# Patient Record
Sex: Female | Born: 1955
Health system: Southern US, Community
[De-identification: ages and names within clinical notes are randomized; demographics above are authoritative.]

## PROBLEM LIST (undated history)

## (undated) DIAGNOSIS — R896 Abnormal cytological findings in specimens from other organs, systems and tissues: Secondary | ICD-10-CM

## (undated) DIAGNOSIS — E785 Hyperlipidemia, unspecified: Secondary | ICD-10-CM

## (undated) DIAGNOSIS — R011 Cardiac murmur, unspecified: Secondary | ICD-10-CM

## (undated) DIAGNOSIS — N920 Excessive and frequent menstruation with regular cycle: Secondary | ICD-10-CM

## (undated) DIAGNOSIS — M8580 Other specified disorders of bone density and structure, unspecified site: Secondary | ICD-10-CM

## (undated) DIAGNOSIS — E039 Hypothyroidism, unspecified: Secondary | ICD-10-CM

## (undated) DIAGNOSIS — R1900 Intra-abdominal and pelvic swelling, mass and lump, unspecified site: Secondary | ICD-10-CM

## (undated) DIAGNOSIS — I10 Essential (primary) hypertension: Secondary | ICD-10-CM

## (undated) DIAGNOSIS — M810 Age-related osteoporosis without current pathological fracture: Secondary | ICD-10-CM

## (undated) DIAGNOSIS — R000 Tachycardia, unspecified: Secondary | ICD-10-CM

## (undated) DIAGNOSIS — R0683 Snoring: Secondary | ICD-10-CM

## (undated) DIAGNOSIS — D219 Benign neoplasm of connective and other soft tissue, unspecified: Secondary | ICD-10-CM

## (undated) DIAGNOSIS — R400 Somnolence: Secondary | ICD-10-CM

## (undated) DIAGNOSIS — N7011 Chronic salpingitis: Secondary | ICD-10-CM

## (undated) DIAGNOSIS — R002 Palpitations: Secondary | ICD-10-CM

## (undated) DIAGNOSIS — G43909 Migraine, unspecified, not intractable, without status migrainosus: Secondary | ICD-10-CM

## (undated) HISTORY — DX: Somnolence: R40.0

## (undated) HISTORY — DX: Intra-abdominal and pelvic swelling, mass and lump, unspecified site: R19.00

## (undated) HISTORY — DX: Tachycardia, unspecified: R00.0

## (undated) HISTORY — DX: Chronic salpingitis: N70.11

## (undated) HISTORY — DX: Palpitations: R00.2

## (undated) HISTORY — DX: Essential (primary) hypertension: I10

## (undated) HISTORY — DX: Snoring: R06.83

## (undated) HISTORY — DX: Excessive and frequent menstruation with regular cycle: N92.0

## (undated) HISTORY — DX: Migraine, unspecified, not intractable, without status migrainosus: G43.909

## (undated) HISTORY — DX: Hyperlipidemia, unspecified: E78.5

## (undated) HISTORY — DX: Cardiac murmur, unspecified: R01.1

## (undated) HISTORY — DX: Other specified disorders of bone density and structure, unspecified site: M85.80

## (undated) HISTORY — DX: Benign neoplasm of connective and other soft tissue, unspecified: D21.9

## (undated) HISTORY — DX: Hypothyroidism, unspecified: E03.9

## (undated) HISTORY — DX: Abnormal cytological findings in specimens from other organs, systems and tissues: R89.6

## (undated) HISTORY — PX: OTHER SURGICAL HISTORY: SHX169

## (undated) HISTORY — DX: Age-related osteoporosis without current pathological fracture: M81.0

---

## 1998-08-17 ENCOUNTER — Other Ambulatory Visit: Admission: RE | Admit: 1998-08-17 | Discharge: 1998-08-17 | Payer: Self-pay | Admitting: Obstetrics and Gynecology

## 1999-11-16 ENCOUNTER — Other Ambulatory Visit: Admission: RE | Admit: 1999-11-16 | Discharge: 1999-11-16 | Payer: Self-pay | Admitting: Obstetrics and Gynecology

## 2000-02-14 ENCOUNTER — Encounter: Payer: Self-pay | Admitting: Internal Medicine

## 2000-02-14 ENCOUNTER — Encounter: Admission: RE | Admit: 2000-02-14 | Discharge: 2000-02-14 | Payer: Self-pay | Admitting: Internal Medicine

## 2001-01-15 ENCOUNTER — Other Ambulatory Visit: Admission: RE | Admit: 2001-01-15 | Discharge: 2001-01-15 | Payer: Self-pay | Admitting: Obstetrics and Gynecology

## 2002-10-22 ENCOUNTER — Other Ambulatory Visit: Admission: RE | Admit: 2002-10-22 | Discharge: 2002-10-22 | Payer: Self-pay | Admitting: Obstetrics and Gynecology

## 2004-05-09 DIAGNOSIS — M810 Age-related osteoporosis without current pathological fracture: Secondary | ICD-10-CM

## 2004-05-09 DIAGNOSIS — M8580 Other specified disorders of bone density and structure, unspecified site: Secondary | ICD-10-CM

## 2004-05-09 HISTORY — DX: Other specified disorders of bone density and structure, unspecified site: M85.80

## 2004-05-09 HISTORY — DX: Age-related osteoporosis without current pathological fracture: M81.0

## 2004-10-13 ENCOUNTER — Other Ambulatory Visit: Admission: RE | Admit: 2004-10-13 | Discharge: 2004-10-13 | Payer: Self-pay | Admitting: Obstetrics and Gynecology

## 2006-03-16 ENCOUNTER — Other Ambulatory Visit: Admission: RE | Admit: 2006-03-16 | Discharge: 2006-03-16 | Payer: Self-pay | Admitting: Obstetrics and Gynecology

## 2006-05-12 ENCOUNTER — Encounter: Admission: RE | Admit: 2006-05-12 | Discharge: 2006-05-12 | Payer: Self-pay | Admitting: Radiology

## 2007-05-10 DIAGNOSIS — IMO0001 Reserved for inherently not codable concepts without codable children: Secondary | ICD-10-CM

## 2007-05-10 DIAGNOSIS — N7011 Chronic salpingitis: Secondary | ICD-10-CM

## 2007-05-10 DIAGNOSIS — D219 Benign neoplasm of connective and other soft tissue, unspecified: Secondary | ICD-10-CM

## 2007-05-10 HISTORY — DX: Chronic salpingitis: N70.11

## 2007-05-10 HISTORY — DX: Benign neoplasm of connective and other soft tissue, unspecified: D21.9

## 2007-05-10 HISTORY — DX: Reserved for inherently not codable concepts without codable children: IMO0001

## 2008-05-09 DIAGNOSIS — N920 Excessive and frequent menstruation with regular cycle: Secondary | ICD-10-CM

## 2008-05-09 HISTORY — DX: Excessive and frequent menstruation with regular cycle: N92.0

## 2008-07-14 ENCOUNTER — Ambulatory Visit: Payer: Self-pay | Admitting: Genetic Counselor

## 2008-09-11 ENCOUNTER — Ambulatory Visit: Payer: Self-pay | Admitting: Surgery

## 2008-10-17 ENCOUNTER — Encounter: Admission: RE | Admit: 2008-10-17 | Discharge: 2008-10-17 | Payer: Self-pay | Admitting: Otolaryngology

## 2009-05-28 ENCOUNTER — Ambulatory Visit (HOSPITAL_COMMUNITY): Admission: RE | Admit: 2009-05-28 | Discharge: 2009-05-28 | Payer: Self-pay | Admitting: Obstetrics and Gynecology

## 2009-10-23 ENCOUNTER — Encounter: Admission: RE | Admit: 2009-10-23 | Discharge: 2009-10-23 | Payer: Self-pay | Admitting: Orthopedic Surgery

## 2010-06-15 ENCOUNTER — Other Ambulatory Visit: Payer: Self-pay | Admitting: Obstetrics and Gynecology

## 2010-06-15 DIAGNOSIS — Z803 Family history of malignant neoplasm of breast: Secondary | ICD-10-CM

## 2010-06-15 DIAGNOSIS — R922 Inconclusive mammogram: Secondary | ICD-10-CM

## 2010-06-28 ENCOUNTER — Ambulatory Visit
Admission: RE | Admit: 2010-06-28 | Discharge: 2010-06-28 | Disposition: A | Discharge status: Home / Self Care | Payer: PRIVATE HEALTH INSURANCE | Source: Ambulatory Visit | Attending: Obstetrics and Gynecology | Admitting: Obstetrics and Gynecology

## 2010-06-28 DIAGNOSIS — Z803 Family history of malignant neoplasm of breast: Secondary | ICD-10-CM

## 2010-06-28 DIAGNOSIS — R922 Inconclusive mammogram: Secondary | ICD-10-CM

## 2010-06-28 MED ORDER — GADOBENATE DIMEGLUMINE 529 MG/ML IV SOLN
15.0000 mL | Freq: Once | INTRAVENOUS | Status: AC | PRN
  Administered 2010-06-28: 15 mL via INTRAVENOUS

## 2010-06-30 ENCOUNTER — Other Ambulatory Visit: Payer: Self-pay | Admitting: Obstetrics and Gynecology

## 2010-06-30 DIAGNOSIS — R928 Other abnormal and inconclusive findings on diagnostic imaging of breast: Secondary | ICD-10-CM

## 2010-07-05 ENCOUNTER — Other Ambulatory Visit: Payer: Self-pay

## 2010-07-05 ENCOUNTER — Other Ambulatory Visit: Payer: Self-pay | Admitting: Obstetrics and Gynecology

## 2010-07-05 ENCOUNTER — Ambulatory Visit
Admission: RE | Admit: 2010-07-05 | Discharge: 2010-07-05 | Disposition: A | Discharge status: Home / Self Care | Payer: PRIVATE HEALTH INSURANCE | Source: Ambulatory Visit | Attending: Obstetrics and Gynecology | Admitting: Obstetrics and Gynecology

## 2010-07-05 DIAGNOSIS — R928 Other abnormal and inconclusive findings on diagnostic imaging of breast: Secondary | ICD-10-CM

## 2010-07-06 ENCOUNTER — Other Ambulatory Visit: Payer: Self-pay | Admitting: Obstetrics and Gynecology

## 2010-07-06 DIAGNOSIS — R928 Other abnormal and inconclusive findings on diagnostic imaging of breast: Secondary | ICD-10-CM

## 2010-07-07 ENCOUNTER — Other Ambulatory Visit: Payer: Self-pay | Admitting: Diagnostic Radiology

## 2010-07-07 ENCOUNTER — Ambulatory Visit
Admission: RE | Admit: 2010-07-07 | Discharge: 2010-07-07 | Disposition: A | Discharge status: Home / Self Care | Payer: PRIVATE HEALTH INSURANCE | Source: Ambulatory Visit | Attending: Obstetrics and Gynecology | Admitting: Obstetrics and Gynecology

## 2010-07-07 ENCOUNTER — Other Ambulatory Visit: Payer: PRIVATE HEALTH INSURANCE

## 2010-07-07 DIAGNOSIS — R928 Other abnormal and inconclusive findings on diagnostic imaging of breast: Secondary | ICD-10-CM

## 2010-07-07 MED ORDER — GADOBENATE DIMEGLUMINE 529 MG/ML IV SOLN
10.0000 mL | Freq: Once | INTRAVENOUS | Status: AC | PRN
  Administered 2010-07-07: 10 mL via INTRAVENOUS

## 2010-07-26 LAB — CBC
HCT: 42.2 % (ref 36.0–46.0)
MCHC: 33.9 g/dL (ref 30.0–36.0)
Platelets: 233 10*3/uL (ref 150–400)
RDW: 12.4 % (ref 11.5–15.5)

## 2010-09-21 NOTE — Procedures (Signed)
CAROTID DUPLEX EXAM   INDICATION:  Right ear tinnitus and bruit.   HISTORY:  Diabetes:  No.  Cardiac:  No.  Hypertension:  No.  Smoking:  No.  Previous Surgery:  No.  CV History:  Patient complains of the sound of her heartbeat in her  right ear for 2 months.  Amaurosis Fugax No, Paresthesias No, Hemiparesis No.                                       RIGHT             LEFT  Brachial systolic pressure:         154               148  Brachial Doppler waveforms:         Triphasic         Triphasic  Vertebral direction of flow:        Antegrade         Antegrade  DUPLEX VELOCITIES (cm/sec)  CCA peak systolic                   113               79  ECA peak systolic                   99                66  ICA peak systolic                   133               104  ICA end diastolic                   62                46  PLAQUE MORPHOLOGY:                  Soft              Soft  PLAQUE AMOUNT:                      Mild to moderate  Mild to moderate  PLAQUE LOCATION:                    Mid ICA           Mid ICA   IMPRESSION:  1. A 40-59% right ICA stenosis.  2. A 20-39% left ICA stenosis.        ___________________________________________  V. Charlena Cross, MD   MC/MEDQ  D:  09/11/2008  T:  09/11/2008  Job:  [id number]

## 2011-05-10 DIAGNOSIS — R000 Tachycardia, unspecified: Secondary | ICD-10-CM

## 2011-05-10 DIAGNOSIS — R011 Cardiac murmur, unspecified: Secondary | ICD-10-CM

## 2011-05-10 HISTORY — DX: Cardiac murmur, unspecified: R01.1

## 2011-05-10 HISTORY — DX: Tachycardia, unspecified: R00.0

## 2011-06-24 ENCOUNTER — Ambulatory Visit
Admission: RE | Admit: 2011-06-24 | Discharge: 2011-06-24 | Disposition: A | Discharge status: Home / Self Care | Payer: PRIVATE HEALTH INSURANCE | Source: Ambulatory Visit | Attending: Obstetrics and Gynecology | Admitting: Obstetrics and Gynecology

## 2011-06-24 ENCOUNTER — Other Ambulatory Visit: Payer: Self-pay | Admitting: Obstetrics and Gynecology

## 2011-07-22 HISTORY — PX: TRANSTHORACIC ECHOCARDIOGRAM: SHX275

## 2011-12-07 ENCOUNTER — Telehealth: Payer: Self-pay | Admitting: Obstetrics and Gynecology

## 2011-12-07 NOTE — Telephone Encounter (Signed)
Chandra/call back

## 2011-12-08 NOTE — Telephone Encounter (Signed)
Tc to pt per telephone call. Pt needs an appt to discuss tx for osteoporosis. Appt sched 12/20/11@11 :15 with vph. Pt agrees.

## 2011-12-14 DIAGNOSIS — G43909 Migraine, unspecified, not intractable, without status migrainosus: Secondary | ICD-10-CM

## 2011-12-14 DIAGNOSIS — R000 Tachycardia, unspecified: Secondary | ICD-10-CM | POA: Insufficient documentation

## 2011-12-14 DIAGNOSIS — M810 Age-related osteoporosis without current pathological fracture: Secondary | ICD-10-CM

## 2011-12-14 DIAGNOSIS — R1900 Intra-abdominal and pelvic swelling, mass and lump, unspecified site: Secondary | ICD-10-CM | POA: Insufficient documentation

## 2011-12-14 DIAGNOSIS — N7011 Chronic salpingitis: Secondary | ICD-10-CM | POA: Insufficient documentation

## 2011-12-14 DIAGNOSIS — IMO0001 Reserved for inherently not codable concepts without codable children: Secondary | ICD-10-CM

## 2011-12-14 DIAGNOSIS — N920 Excessive and frequent menstruation with regular cycle: Secondary | ICD-10-CM

## 2011-12-14 DIAGNOSIS — D219 Benign neoplasm of connective and other soft tissue, unspecified: Secondary | ICD-10-CM | POA: Insufficient documentation

## 2011-12-14 DIAGNOSIS — R011 Cardiac murmur, unspecified: Secondary | ICD-10-CM | POA: Insufficient documentation

## 2011-12-14 DIAGNOSIS — M8580 Other specified disorders of bone density and structure, unspecified site: Secondary | ICD-10-CM | POA: Insufficient documentation

## 2011-12-20 ENCOUNTER — Ambulatory Visit (INDEPENDENT_AMBULATORY_CARE_PROVIDER_SITE_OTHER): Payer: PRIVATE HEALTH INSURANCE | Admitting: Obstetrics and Gynecology

## 2011-12-20 ENCOUNTER — Encounter: Payer: Self-pay | Admitting: Obstetrics and Gynecology

## 2011-12-20 VITALS — BP 120/64 | Ht 63.75 in | Wt 168.0 lb

## 2011-12-20 DIAGNOSIS — N951 Menopausal and female climacteric states: Secondary | ICD-10-CM

## 2011-12-20 DIAGNOSIS — M810 Age-related osteoporosis without current pathological fracture: Secondary | ICD-10-CM

## 2011-12-20 MED ORDER — ALENDRONATE SODIUM 70 MG PO TABS: 70.0000 mg | ORAL_TABLET | ORAL | Status: AC

## 2011-12-20 NOTE — Progress Notes (Signed)
GYN PROBLEM VISIT  [name] is a 56 y.o. year old female,G2P2, who presents for a problem visit.   Subjective:  Pt here to discuss medication for dx of osteoporosis back in 06/2011.  She has since that time been evaluated by Dr. Rennis Golden at [facility] vascular who has started propranolol with improvement in her migraine headaches and in her palpitations. Improvement in palpitations have allowed her to sleep better. She continues with some hot flashes.  Objective:  BP 120/64  Ht 5' 3.75" (1.619 m)  Wt 168 lb (76.204 kg)  BMI 29.06 kg/m2     Assessment: Osteoporosis with normal FRAX Family history of osteoporosis Strong family history of breast cancer Improved migraines and palpitations Continued problems with insomnia and hot flashes   Plan: Options for management of the patient's symptoms are discussed. Evista would address the family history of breast cancer and the osteoporosis but would leave her with increased hot flashes. She does opts for Fosamax 70 mg p.o. Weekly.  She denies any history of reflux disease She will followup in 6 weeks. She is counseled on increasing her exercise to 6 days per week and maintaining her calcium and vitamin D   Dierdre Forth, md  12/20/2011 11:41 AM

## 2011-12-20 NOTE — Patient Instructions (Addendum)
Menopause Menopause is the normal time of life when menstrual periods stop completely. Menopause is complete when you have missed 12 consecutive menstrual periods. It usually occurs between the ages of 48 to 55, with an average age of 51. Very rarely does a woman develop menopause before 58 years old. At menopause, your ovaries stop producing the female hormones, estrogen and progesterone. This can cause undesirable symptoms and also affect your health. Sometimes the symptoms may occur 4 to 5 years before the menopause begins. There is no relationship between menopause and:  Oral contraceptives.   Number of children you had.   Race.   The age your menstrual periods started (menarche).  Heavy smokers and very thin women may develop menopause earlier in life. CAUSES  The ovaries stop producing the female hormones estrogen and progesterone.   Other causes include:   Surgery to remove both ovaries.   The ovaries stop functioning for no known reason.   Tumors of the pituitary gland in the brain.   Medical disease that affects the ovaries and hormone production.   Radiation treatment to the abdomen or pelvis.   Chemotherapy that affects the ovaries.  SYMPTOMS   Hot flashes.   Night sweats.   Decrease in sex drive.   Vaginal dryness and thinning of the vagina causing painful intercourse.   Dryness of the skin and developing wrinkles.   Headaches.   Tiredness.   Irritability.   Memory problems.   Weight gain.   Bladder infections.   Hair growth of the face and chest.   Infertility.  More serious symptoms include:  Loss of bone (osteoporosis) causing breaks (fractures).   Depression.   Hardening and narrowing of the arteries (atherosclerosis) causing heart attacks and strokes.  DIAGNOSIS   When the menstrual periods have stopped for 12 straight months.   Physical exam.   Hormone studies of the blood.  TREATMENT  There are many treatment choices and nearly  as many questions about them. The decisions to treat or not to treat menopausal changes is an individual choice made with your caregiver. Your caregiver can discuss the treatments with you. Together, you can decide which treatment will work best for you. Your treatment choices may include:   Hormone therapy (estorgen and progesterone).   Non-hormonal medications.   Treating the individual symptoms with medication (for example antidepressants for depression).   Herbal medications that may help specific symptoms.   Counseling by a psychiatrist or psychologist.   Group therapy.   Lifestyle changes including:   Eating healthy.   Regular exercise.   Limiting caffeine and alcohol.   Stress management and meditation.   No treatment.  HOME CARE INSTRUCTIONS   Take the medication your caregiver gives you as directed.   Get plenty of sleep and rest.   Exercise regularly.   Eat a diet that contains calcium (good for the bones) and soy products (acts like estrogen hormone).   Avoid alcoholic beverages.   Do not smoke.   If you have hot flashes, dress in layers.   Take supplements, calcium and vitamin D to strengthen bones.   You can use over-the-counter lubricants or moisturizers for vaginal dryness.   Group therapy is sometimes very helpful.   Acupuncture may be helpful in some cases.  SEEK MEDICAL CARE IF:   You are not sure you are in menopause.   You are having menopausal symptoms and need advice and treatment.   You are still having menstrual periods after age 55.     You have pain with intercourse.   Menopause is complete (no menstrual period for 12 months) and you develop vaginal bleeding.   You need a referral to a specialist (gynecologist, psychiatrist or psychologist) for treatment.  SEEK IMMEDIATE MEDICAL CARE IF:   You have severe depression.   You have excessive vaginal bleeding.   You fell and think you have a broken bone.   You have pain when you  urinate.   You develop leg or chest pain.   You have a fast pounding heart beat (palpitations).   You have severe headaches.   You develop vision problems.   You feel a lump in your breast.   You have abdominal pain or severe indigestion.  Document Released: 07/16/2003 Document Revised: 04/14/2011 Document Reviewed: 02/21/2008 [employer] Patient Information 2012 [employer], [place] Osteoporosis Osteoporosis is a disease of the bones that makes them weaker and prone to break (fracture). By their mid-30s, most people begin to gradually lose bone strength. If this is severe enough, osteoporosis may occur. Osteopenia is a less severe weakness of the bones, which places you at risk for osteoporosis. It is important to identify if you have osteoporosis or osteopenia. Bone fractures from osteoporosis (especially hip and spine fractures) are a major cause of hospitalization, loss of independence, and can lead to life-threatening complications. CAUSES  There are a number of causes and risk factors:  Gender. Women are at a higher risk for osteoporosis than men.   Age. Bone formation slows down with age.   Ethnicity. For unclear reasons, white and Asian women are at higher risk for osteoporosis. Hispanic and African American women are at increased, but lesser, risk.   Family history of osteoporosis can mean that you are at a higher risk for getting it.   History of bone fractures indicates you may be at higher risk of another.   Calcium is very important for bone health and strength. Not enough calcium in your diet increases your risk for osteoporosis. Vitamin D is important for calcium metabolism. You get vitamin D from sunlight, foods, or supplements.   Physical activity. Bones get stronger with weight-bearing exercise and weaker without use.   Smoking is associated with decreased bone strength.   Medicines. Cortisone medicines, too much thyroid medicine, some cancer and seizure medicines, and  others can weaken bones and cause osteoporosis.   Decreased body weight is associated with osteoporosis. The small amount of estrogen-type molecules produced in fat cells seems to protect the bones.   Menopausal decrease in the hormone estrogen can cause osteoporosis.   Low levels of the hormone testosterone can cause osteoporosis.   Some medical conditions can lead to osteoporosis (hyperthyroidism, hyperparathyroidism, B12 deficiency).  SYMPTOMS  Usually, no symptoms are felt as the bones weaken. The first symptoms are generally related to bone fractures. You may have silent, tiny bone fractures, especially in your spine. This can cause height loss and forward bending of the spine (kyphosis). DIAGNOSIS  You or your caregiver may suspect osteoporosis based on height loss and kyphosis. Osteoporosis or osteopenia may be identified on an X-ray done for other reasons. A bone density measurement will likely be taken. Your bones are often measured at your lower spine or your hips. Measurement is done by an X-ray called a DEXA scan, or sometimes by a computerized X-ray scan (CT or CAT scan). Other tests may be done to find the cause of osteoporosis, such as blood tests to measure calcium and vitamin D, or to monitor treatment. TREATMENT  The goal of osteoporosis treatment is to prevent fractures. This is done through medicine and home care treatments. Treatment will slow the weakening of your bones and strengthen them where possible. Measures to decrease the likelihood of falling and fracturing a bone are also important. Medicine  You may need supplements if you are not getting enough calcium, vitamin D, and vitamin B12.   If you are female and menopausal, you should discuss the option of estrogen replacement or estrogen-like medicine with your caregiver.   Medicines can be taken by mouth or injection to help build bone strength. When taken by mouth, there are important directions that you need to  follow.   Calcitonin is a hormone made by the thyroid gland that can help build bone strength and decrease fracture risk in the spine. It can be taken by nasal spray or injection.   Parathyroid hormone can be injected to help build bone strength.   You will need to continue to get enough calcium intake with any of these medicines.  FALL PREVENTION  If you are unsteady on your feet, use a cane, walker, or walk with someone's help.   Remove loose rugs or electrical cords from your home.   Keep your home well lit at night. Use glasses if you need them.   Avoid icy streets and wet or waxed floors.   Hold the railing when using stairs.   Watch out for your pets.   Install grab bars in your bathroom.   Exercise. Physical activity, especially weight-bearing exercise, helps strengthen bones. Strength and balance exercise, such as tai chi, helps prevent falls.   Alcohol and some medicines can make you more likely to fall. Discuss alcohol use with your caregiver. Ask your caregiver if any of your medicines might increase your risk for falling. Ask if safer alternatives are available.  HOME CARE INSTRUCTIONS   Try to prevent and avoid falls.   To pick up objects, bend at the knees. Do not bend with your back.   Do not smoke. If you smoke, ask for help to stop.   Have adequate calcium and vitamin D in your diet. Talk with your caregiver about amounts.   Before exercising, ask your caregiver what exercises will be good for you.   Only take over-the-counter or prescription medicines for pain, discomfort, or fever as directed by your caregiver.  SEEK MEDICAL CARE IF:   You have had a fracture and your pain is not controlled.   You have had a fracture and you are not able to return to activities as expected.   You are reinjured.   You develop side effects from medicines, especially stomach pain or trouble swallowing.   You develop new, unexplained problems.  SEEK IMMEDIATE MEDICAL  CARE IF:   You develop sudden, severe pain in your back.   You develop pain after an injury or fall.  Document Released: 02/02/2005 Document Revised: 04/14/2011 Document Reviewed: 04/09/2011 [facility] Patient Information 2012 [place], [place]..Insomnia Insomnia is frequent trouble falling and/or staying asleep. Insomnia can be a long term problem or a short term problem. Both are common. Insomnia can be a short term problem when the wakefulness is related to a certain stress or worry. Long term insomnia is often related to ongoing stress during waking hours and/or poor sleeping habits. Overtime, sleep deprivation itself can make the problem worse. Every little thing feels more severe because you are overtired and your ability to cope is decreased. CAUSES   Stress, anxiety, and  depression.   Poor sleeping habits.   Distractions such as TV in the bedroom.   Naps close to bedtime.   Engaging in emotionally charged conversations before bed.   Technical reading before sleep.   Alcohol and other sedatives. They may make the problem worse. They can hurt normal sleep patterns and normal dream activity.   Stimulants such as caffeine for several hours prior to bedtime.   Pain syndromes and shortness of breath can cause insomnia.   Exercise late at night.   Changing time zones may cause sleeping problems (jet lag).  It is sometimes helpful to have someone observe your sleeping patterns. They should look for periods of not breathing during the night (sleep apnea). They should also look to see how long those periods last. If you live alone or observers are uncertain, you can also be observed at a sleep clinic where your sleep patterns will be professionally monitored. Sleep apnea requires a checkup and treatment. Give your caregivers your medical history. Give your caregivers observations your family has made about your sleep.  SYMPTOMS   Not feeling rested in the morning.   Anxiety and  restlessness at bedtime.   Difficulty falling and staying asleep.  TREATMENT   Your caregiver may prescribe treatment for an underlying medical disorders. Your caregiver can give advice or help if you are using alcohol or other drugs for self-medication. Treatment of underlying problems will usually eliminate insomnia problems.   Medications can be prescribed for short time use. They are generally not recommended for lengthy use.   Over-the-counter sleep medicines are not recommended for lengthy use. They can be habit forming.   You can promote easier sleeping by making lifestyle changes such as:   Using relaxation techniques that help with breathing and reduce muscle tension.   Exercising earlier in the day.   Changing your diet and the time of your last meal. No night time snacks.   Establish a regular time to go to bed.   Counseling can help with stressful problems and worry.   Soothing music and white noise may be helpful if there are background noises you cannot remove.   Stop tedious detailed work at least one hour before bedtime.  HOME CARE INSTRUCTIONS   Keep a diary. Inform your caregiver about your progress. This includes any medication side effects. See your caregiver regularly. Take note of:   Times when you are asleep.   Times when you are awake during the night.   The quality of your sleep.   How you feel the next day.  This information will help your caregiver care for you.  Get out of bed if you are still awake after 15 minutes. Read or do some quiet activity. Keep the lights down. Wait until you feel sleepy and go back to bed.   Keep regular sleeping and waking hours. Avoid naps.   Exercise regularly.   Avoid distractions at bedtime. Distractions include watching television or engaging in any intense or detailed activity like attempting to balance the household checkbook.   Develop a bedtime ritual. Keep a familiar routine of bathing, brushing your  teeth, climbing into bed at the same time each night, listening to soothing music. Routines increase the success of falling to sleep faster.   Use relaxation techniques. This can be using breathing and muscle tension release routines. It can also include visualizing peaceful scenes. You can also help control troubling or intruding thoughts by keeping your mind occupied with boring or  repetitive thoughts like the old concept of counting sheep. You can make it more creative like imagining planting one beautiful flower after another in your backyard garden.   During your day, work to eliminate stress. When this is not possible use some of the previous suggestions to help reduce the anxiety that accompanies stressful situations.  MAKE SURE YOU:   Understand these instructions.   Will watch your condition.   Will get help right away if you are not doing well or get worse.  Document Released: 04/22/2000 Document Revised: 04/14/2011 Document Reviewed: 05/23/2007 [facility] Patient Information 2012 [place], [place].

## 2012-02-02 ENCOUNTER — Encounter: Payer: Self-pay | Admitting: Obstetrics and Gynecology

## 2012-02-02 ENCOUNTER — Ambulatory Visit (INDEPENDENT_AMBULATORY_CARE_PROVIDER_SITE_OTHER): Payer: PRIVATE HEALTH INSURANCE | Admitting: Obstetrics and Gynecology

## 2012-02-02 VITALS — BP 116/66 | Ht 63.0 in | Wt 169.0 lb

## 2012-02-02 DIAGNOSIS — M949 Disorder of cartilage, unspecified: Secondary | ICD-10-CM

## 2012-02-02 DIAGNOSIS — M899 Disorder of bone, unspecified: Secondary | ICD-10-CM

## 2012-02-02 DIAGNOSIS — M810 Age-related osteoporosis without current pathological fracture: Secondary | ICD-10-CM

## 2012-02-02 DIAGNOSIS — M8580 Other specified disorders of bone density and structure, unspecified site: Secondary | ICD-10-CM

## 2012-02-02 NOTE — Progress Notes (Signed)
GYN PROBLEM VISIT  F/U  [name] is a 56 y.o. year old female,G2P2, who presents for followup of Fosamax started 6 wks ago  Subjective:  Pt is here to f/u on medication Fosamax started at last visit 12/20/2011. She denies any sx of reflux, no nausea or vomiting. Pt states that she is also still having hot flashes although they are not as often as before. She notes that her hot flashes are associated with increased palpitaitons. She notes her migraines have almost disappeared altogether.  Objective:  BP 116/66  Ht 5\' 3"  (1.6 m)  Wt 169 lb (76.658 kg)  BMI 29.94 kg/m2    Assessment: Osteoporosis, tolerating Rx well Palpitations improving but associated with hot flashes  Plan: Continue Fosamax Keep appt with cardiologist F/u aex  Dierdre Forth, MD  02/02/2012 11:07 AM

## 2012-08-20 IMAGING — CR DG LUMBAR SPINE COMPLETE 4+V
5 series · 5 of 5 positions shown · non-contrast
Comparison: None

CLINICAL DATA: Back pain.

LUMBAR SPINE - COMPLETE 4+ VIEW

[view not recorded (1 of 5)]
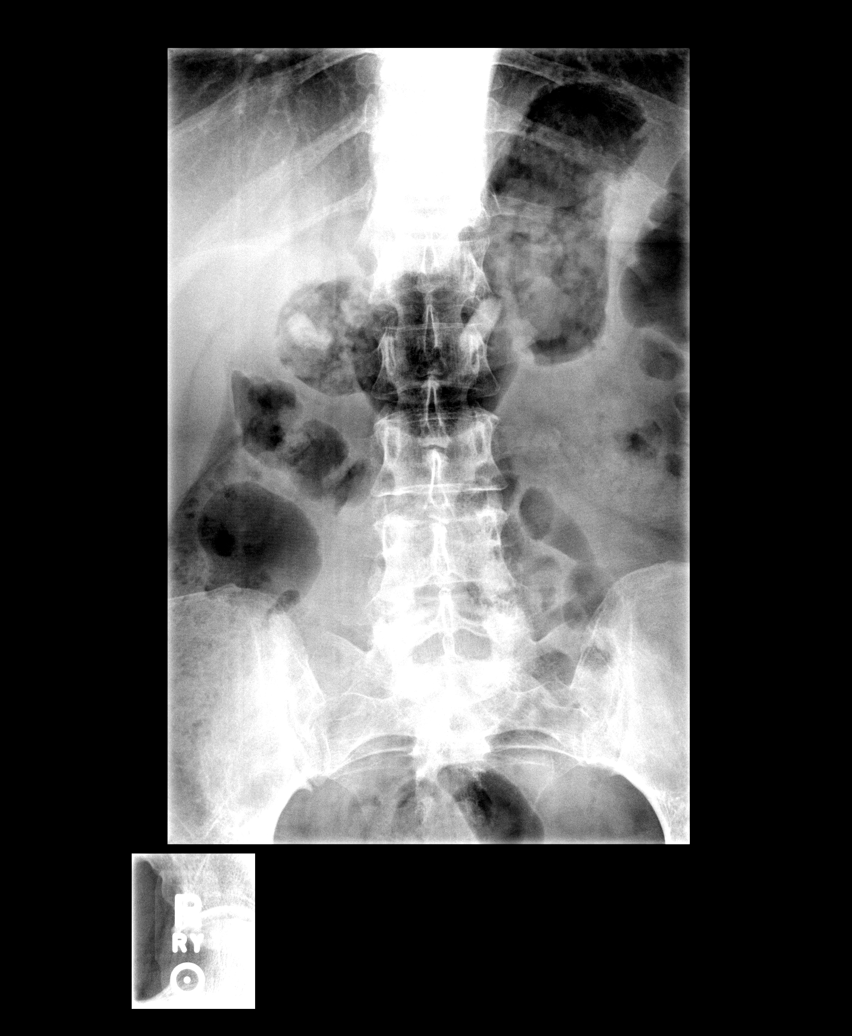

[view not recorded (2 of 5)]
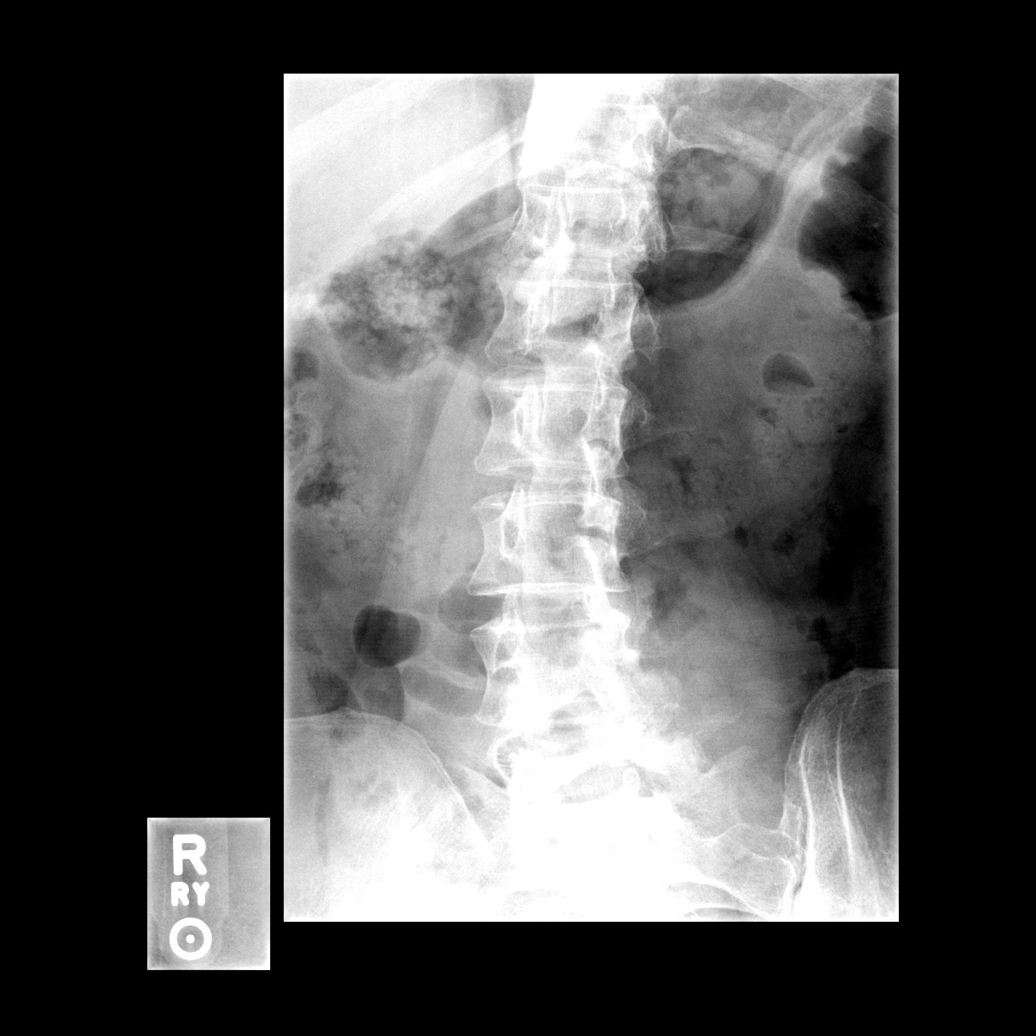

[view not recorded (3 of 5)]
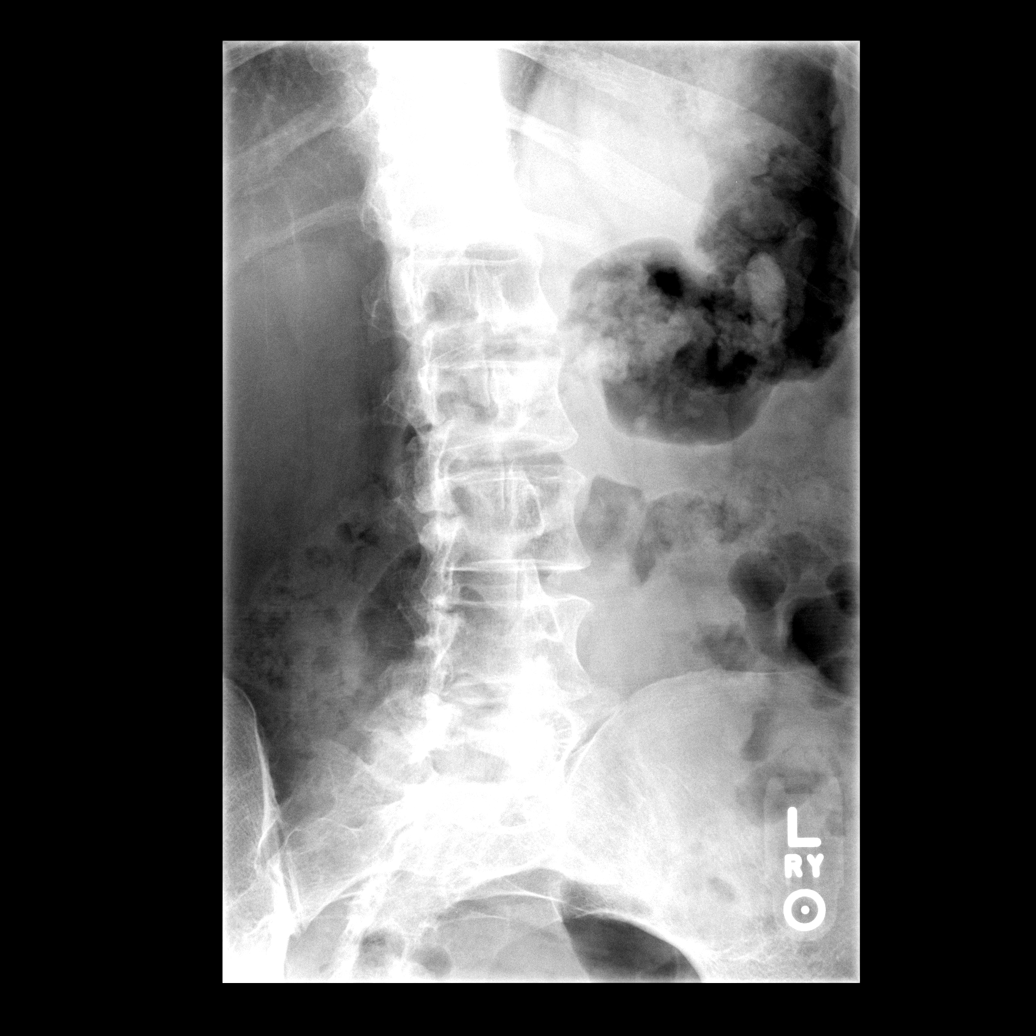

[view not recorded (4 of 5)]
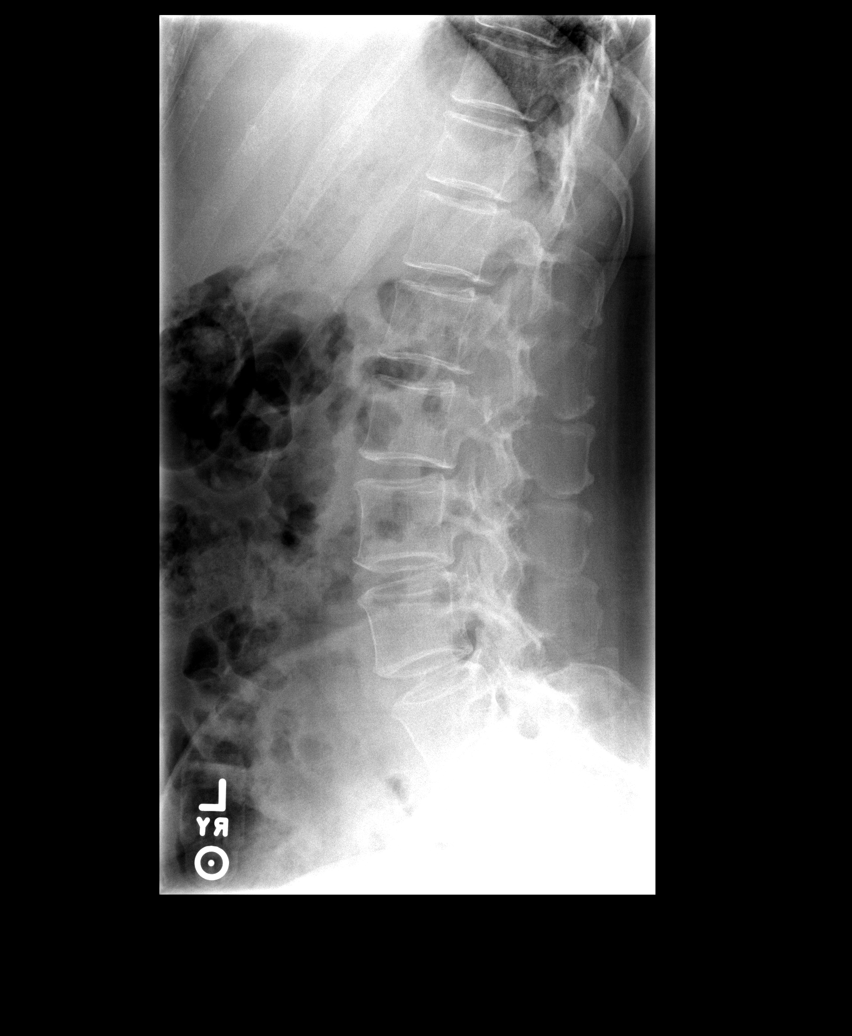

[view not recorded (5 of 5)]
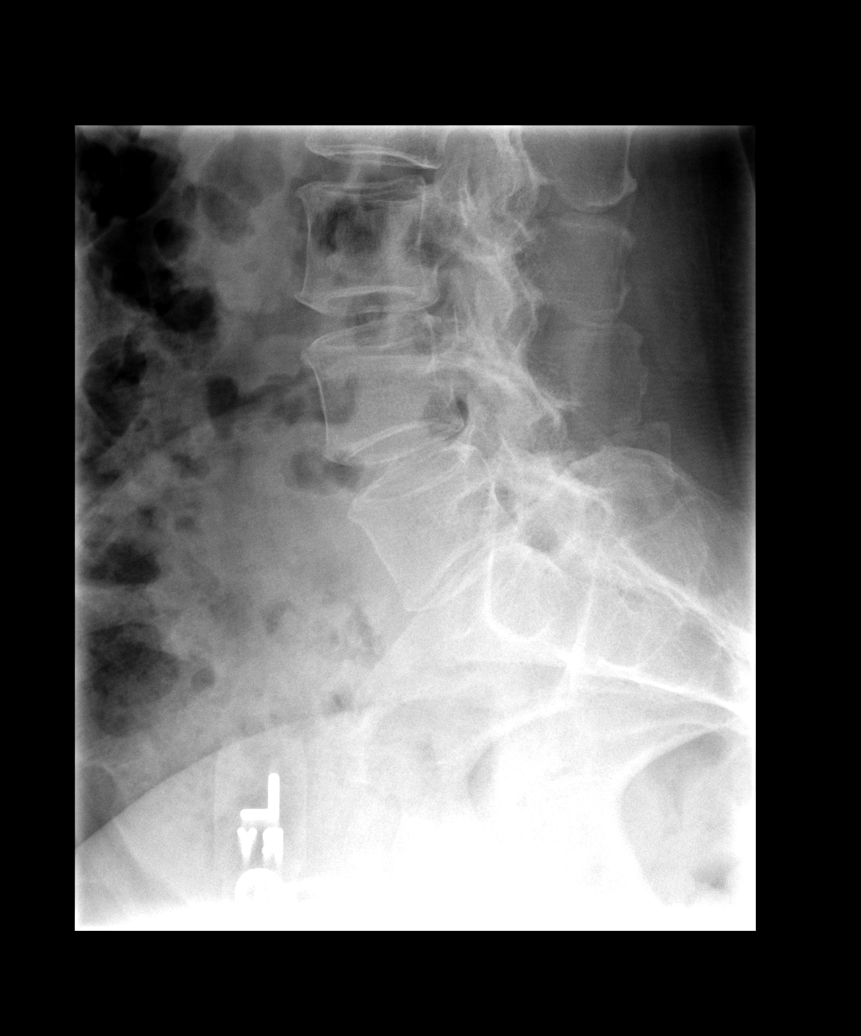

[5 of 5 positions shown; findings below may reference images not displayed]

FINDINGS: The lateral film demonstrates normal alignment.
Vertebral bodies and disc spaces are maintained.  No acute bony
findings.  Normal alignment of the facet joints and no pars
defects.  The visualized bony pelvis in intact.
IMPRESSION: Normal alignment and no acute bony findings or significant
degenerative changes.

## 2013-02-20 ENCOUNTER — Encounter: Payer: Self-pay | Admitting: Internal Medicine

## 2013-02-21 ENCOUNTER — Ambulatory Visit (INDEPENDENT_AMBULATORY_CARE_PROVIDER_SITE_OTHER): Payer: PRIVATE HEALTH INSURANCE | Admitting: Internal Medicine

## 2013-02-21 ENCOUNTER — Encounter: Payer: Self-pay | Admitting: Internal Medicine

## 2013-02-21 VITALS — BP 130/90 | HR 56 | Ht 64.0 in | Wt 171.3 lb

## 2013-02-21 DIAGNOSIS — E785 Hyperlipidemia, unspecified: Secondary | ICD-10-CM

## 2013-02-21 DIAGNOSIS — R002 Palpitations: Secondary | ICD-10-CM

## 2013-02-21 DIAGNOSIS — G43909 Migraine, unspecified, not intractable, without status migrainosus: Secondary | ICD-10-CM

## 2013-02-21 NOTE — Patient Instructions (Signed)
Your physician recommends that you return for lab work in a few weeks. You will need to be fasting for this blood work.  Your physician wants you to follow-up in: 1 year. You will receive a reminder letter in the mail two months in advance. If you don't receive a letter, please call our office to schedule the follow-up appointment.

## 2013-02-25 ENCOUNTER — Encounter: Payer: Self-pay | Admitting: Internal Medicine

## 2013-02-25 DIAGNOSIS — R002 Palpitations: Secondary | ICD-10-CM | POA: Insufficient documentation

## 2013-02-25 NOTE — Progress Notes (Signed)
OFFICE NOTE  Chief Complaint:  Routine follow-up  Primary Care Physician: Alva Garnet., MD  HPI:  Susan Valencia is a 57 yo female with a history of palpitations, elevated BP, migraine headaches and soft flow murmur. After our first visit, I elected to start her on low dose propranolol XR 60 mg daily. She reported a marked improvement in her palpitations and almost a complete disappearance of her migraine headaches.  Since that time, she was getting some residual palpitations, only in the late evening. She then switched her medication to QHS dosing and noted an improvement. Today she is pretty much without complaints.  PMHx:  Past Medical History  Diagnosis Date  . Migraine     with menses  . Osteopenia 2006  . Osteoporosis 2006  . Pelvic mass   . Menorrhagia 2010  . Fibroid 2009  . Hydrosalpinx 2009  . Tachycardia 2013  . Systolic murmur 2013  . ASCUS (atypical squamous cells of undetermined significance) on Pap smear 2009  . Palpitations   . Dyslipidemia     Past Surgical History  Procedure Laterality Date  . Hysteroscopic resection    . Cesarean section    . Transthoracic echocardiogram  07/22/2011    EF >55%, normal    FAMHx:  Family History  Problem Relation Age of Onset  . Cancer Mother     premenopausal breast   . Anuerysm Mother     abdominal and cerebral  . Cancer Sister     premenopausal breast  . Hypertension Father   . Heart disease Father 26  . Anuerysm Sister     SOCHx:   reports that she has never smoked. She has never used smokeless tobacco. She reports that she does not drink alcohol or use illicit drugs.  ALLERGIES:  Allergies  Allergen Reactions  . Multihance [Gadobenate Dimeglumine] Nausea And Vomiting    Pt has nausea and vomiting after injection of multihance for mri.    ROS: A comprehensive review of systems was negative except for: Cardiovascular: positive for palpitations  HOME MEDS: Current Outpatient Prescriptions   Medication Sig Dispense Refill  . alendronate (FOSAMAX) 70 MG tablet Take 70 mg by mouth every 7 (seven) days. Take with a full glass of water on an empty stomach.      . IBUPROFEN PO Take by mouth as needed.      . Methylcellulose, Laxative, (CITRUCEL PO) Take by mouth.      . Multiple Vitamin (MULTIVITAMIN) tablet Take 1 tablet by mouth daily.      . Naproxen Sodium (ALEVE PO) Take by mouth.      . omega-3 acid ethyl esters (LOVAZA) 1 G capsule Take 2 g by mouth 2 (two) times daily.       . propranolol (INDERAL) 60 MG tablet Take 60 mg by mouth daily.        No current facility-administered medications for this visit.    LABS/IMAGING: No results found for this or any previous visit (from the past 48 hour(s)). No results found.  VITALS: BP 130/90  Pulse 56  Ht 5\' 4"  (1.626 m)  Wt 171 lb 4.8 oz (77.701 kg)  BMI 29.39 kg/m2  EXAM: General appearance: alert and no distress Lungs: clear to auscultation bilaterally Heart: regular rate and rhythm, S1, S2 normal, no murmur, click, rub or gallop Extremities: extremities normal, atraumatic, no cyanosis or edema Pulses: 2+ and symmetric  EKG: Sinus bradycardia at 56  ASSESSMENT: 1. Palpitations - controlled on  propranolol 2. Migraine headaches - improved with b-blocker  PLAN: 1.   Continue current dose propranolol. There is room for dose increase to 80 mg if palpitations start to come back.  Follow-up with me annually, or sooner as necessary.  Chrystie Nose, MD, [facility] Attending Cardiologist [facility]  , C 02/25/2013, 3:01 PM

## 2013-02-26 ENCOUNTER — Other Ambulatory Visit: Payer: Self-pay | Admitting: Internal Medicine

## 2013-02-26 NOTE — Telephone Encounter (Signed)
Rx was sent to pharmacy electronically. 

## 2013-03-10 LAB — NMR LIPOPROFILE WITH LIPIDS
HDL Particle Number: 25.9 umol/L — ABNORMAL LOW (ref >=30.5)
LDL (calc): 150 mg/dL — ABNORMAL HIGH (ref <100)
LDL Size: 21.5 nm (ref >20.5)
LP-IR Score: <25 (ref <=45)
Large HDL-P: 7.3 umol/L (ref >=4.8)
Large VLDL-P: 0.9 nmol/L (ref <=2.7)
Small LDL Particle Number: 736 nmol/L — ABNORMAL HIGH (ref <=527)

## 2013-03-13 ENCOUNTER — Telehealth: Payer: Self-pay | Admitting: *Deleted

## 2013-03-13 DIAGNOSIS — Z79899 Other long term (current) drug therapy: Secondary | ICD-10-CM

## 2013-03-13 MED ORDER — ATORVASTATIN CALCIUM 40 MG PO TABS: 40.0000 mg | ORAL_TABLET | Freq: Every day | ORAL | Status: DC

## 2013-03-13 NOTE — Telephone Encounter (Signed)
Called patient with lab results - informed of need to start statin (atorvastatin 40mg  QD) and check labs in 1 week. Patient agreeable. Labs & medication ordered. Lab slips mailed to patient.

## 2013-03-13 NOTE — Telephone Encounter (Signed)
Message copied by Lindell Spar on Wed Mar 13, 2013  8:27 AM ------      Message from: Chrystie Nose      Created: Tue Mar 12, 2013  8:13 AM       Cholesterol is too high - I would recommend she start on a statin - atorvastatin 40 mg daily. Check CMP and CK in 1 week if she is agreeable.            -Dr. Rennis Golden ------

## 2013-03-14 ENCOUNTER — Other Ambulatory Visit: Payer: Self-pay

## 2013-03-21 ENCOUNTER — Other Ambulatory Visit: Payer: Self-pay | Admitting: Internal Medicine

## 2013-03-22 LAB — NMR LIPOPROFILE WITH LIPIDS
Cholesterol, Total: 134 mg/dL (ref <200)
HDL Particle Number: 32.3 umol/L (ref >=30.5)
Large HDL-P: 6.4 umol/L (ref >=4.8)
Large VLDL-P: 2.8 nmol/L — ABNORMAL HIGH (ref <=2.7)
Triglycerides: 81 mg/dL (ref <150)

## 2013-10-29 ENCOUNTER — Other Ambulatory Visit: Payer: Self-pay | Admitting: Obstetrics and Gynecology

## 2014-02-19 ENCOUNTER — Other Ambulatory Visit: Payer: Self-pay | Admitting: Internal Medicine

## 2014-02-19 NOTE — Telephone Encounter (Signed)
Rx was sent to pharmacy electronically. 

## 2014-03-10 ENCOUNTER — Encounter: Payer: Self-pay | Admitting: Internal Medicine

## 2014-03-21 ENCOUNTER — Other Ambulatory Visit: Payer: Self-pay | Admitting: Internal Medicine

## 2014-03-24 NOTE — Telephone Encounter (Signed)
Rx refill sent to patient pharmacy   

## 2014-03-31 ENCOUNTER — Ambulatory Visit (INDEPENDENT_AMBULATORY_CARE_PROVIDER_SITE_OTHER): Payer: PRIVATE HEALTH INSURANCE | Admitting: Internal Medicine

## 2014-03-31 ENCOUNTER — Encounter: Payer: Self-pay | Admitting: Internal Medicine

## 2014-03-31 VITALS — BP 136/70 | HR 72 | Ht 64.0 in | Wt 178.3 lb

## 2014-03-31 DIAGNOSIS — E785 Hyperlipidemia, unspecified: Secondary | ICD-10-CM

## 2014-03-31 DIAGNOSIS — R002 Palpitations: Secondary | ICD-10-CM

## 2014-03-31 MED ORDER — ATORVASTATIN CALCIUM 40 MG PO TABS: 40.0000 mg | ORAL_TABLET | Freq: Every day | ORAL | Status: DC

## 2014-03-31 MED ORDER — PROPRANOLOL HCL ER 60 MG PO CP24: 60.0000 mg | ORAL_CAPSULE | Freq: Every day | ORAL | Status: DC

## 2014-03-31 NOTE — Progress Notes (Signed)
OFFICE NOTE  Chief Complaint:  Routine follow-up  Primary Care Physician: No PCP Per Patient  HPI:  Susan Valencia is a 58 yo female with a history of palpitations, elevated BP, migraine headaches and soft flow murmur. After our first visit, I elected to start her on low dose propranolol XR 60 mg daily. She reported a marked improvement in her palpitations and almost a complete disappearance of her migraine headaches.  Since that time, she was getting some residual palpitations, only in the late evening. She then switched her medication to QHS dosing and noted an improvement. Today she is pretty much without complaints.  PMHx:  Past Medical History  Diagnosis Date  . Migraine     with menses  . Osteopenia 2006  . Osteoporosis 2006  . Pelvic mass   . Menorrhagia 2010  . Fibroid 2009  . Hydrosalpinx 2009  . Tachycardia 2013  . Systolic murmur 3532  . ASCUS (atypical squamous cells of undetermined significance) on Pap smear 2009  . Palpitations   . Dyslipidemia     Past Surgical History  Procedure Laterality Date  . Hysteroscopic resection    . Cesarean section    . Transthoracic echocardiogram  07/22/2011    EF >55%, normal    FAMHx:  Family History  Problem Relation Age of Onset  . Cancer Mother     premenopausal breast   . Anuerysm Mother     abdominal and cerebral  . Cancer Sister     premenopausal breast  . Hypertension Father   . Heart disease Father 16  . Anuerysm Sister     SOCHx:   reports that she has never smoked. She has never used smokeless tobacco. She reports that she does not drink alcohol or use illicit drugs.  ALLERGIES:  Allergies  Allergen Reactions  . Multihance [Gadobenate Dimeglumine] Nausea And Vomiting    Pt has nausea and vomiting after injection of multihance for mri.    ROS: A comprehensive review of systems was negative except for: Cardiovascular: positive for palpitations  HOME MEDS: Current Outpatient Prescriptions    Medication Sig Dispense Refill  . alendronate (FOSAMAX) 70 MG tablet Take 70 mg by mouth every 7 (seven) days. Take with a full glass of water on an empty stomach.    Marland Kitchen atorvastatin (LIPITOR) 40 MG tablet Take 1 tablet (40 mg total) by mouth daily. 30 tablet 11  . EVENING PRIMROSE OIL PO Take by mouth daily.    . IBUPROFEN PO Take by mouth as needed.    Marland Kitchen MAGNESIUM PO Take by mouth daily.    . Methylcellulose, Laxative, (CITRUCEL PO) Take by mouth.    . Multiple Vitamin (MULTIVITAMIN) tablet Take 1 tablet by mouth daily.    . propranolol ER (INDERAL LA) 60 MG 24 hr capsule Take 1 capsule (60 mg total) by mouth daily. 30 capsule 11   No current facility-administered medications for this visit.    LABS/IMAGING: No results found for this or any previous visit (from the past 48 hour(s)). No results found.  VITALS: BP 136/70 mmHg  Pulse 72  Ht 5\' 4"  (1.626 m)  Wt 178 lb 4.8 oz (80.876 kg)  BMI 30.59 kg/m2  EXAM: General appearance: alert and no distress Lungs: clear to auscultation bilaterally Heart: regular rate and rhythm, S1, S2 normal, no murmur, click, rub or gallop Extremities: extremities normal, atraumatic, no cyanosis or edema Pulses: 2+ and symmetric  EKG: Normal sinus rhythm at 72  ASSESSMENT:  1. Palpitations - controlled on propranolol 2. Migraine headaches - improved with b-blocker  PLAN: 1.   Continue current dose propranolol. There is room for dose increase to 80 mg if palpitations start to come back.  Follow-up with me annually, or sooner as necessary.  Pixie Casino, MD, [facility] Attending Cardiologist [facility]  , C 03/31/2014, 8:41 AM

## 2014-03-31 NOTE — Patient Instructions (Signed)
Your physician wants you to follow-up in: 1 year with Dr. Debara Pickett. You will receive a reminder letter in the mail two months in advance. If you don't receive a letter, please call our office to schedule the follow-up appointment.  Please have fasting lab work at [facility].   We have refilled your medications.

## 2014-04-01 LAB — LIPID PANEL
Cholesterol: 137 mg/dL (ref 0–200)
HDL: 52 mg/dL (ref >39)
LDL Cholesterol: 68 mg/dL (ref 0–99)
Total CHOL/HDL Ratio: 2.6 Ratio
Triglycerides: 83 mg/dL (ref <150)
VLDL: 17 mg/dL (ref 0–40)

## 2015-01-26 ENCOUNTER — Encounter: Payer: Self-pay | Admitting: Internal Medicine

## 2015-04-01 ENCOUNTER — Ambulatory Visit (INDEPENDENT_AMBULATORY_CARE_PROVIDER_SITE_OTHER): Payer: PRIVATE HEALTH INSURANCE | Admitting: Internal Medicine

## 2015-04-01 ENCOUNTER — Encounter: Payer: Self-pay | Admitting: Internal Medicine

## 2015-04-01 VITALS — BP 128/90 | HR 66 | Ht 63.5 in | Wt 181.1 lb

## 2015-04-01 DIAGNOSIS — Z1329 Encounter for screening for other suspected endocrine disorder: Secondary | ICD-10-CM | POA: Diagnosis not present

## 2015-04-01 DIAGNOSIS — R002 Palpitations: Secondary | ICD-10-CM | POA: Diagnosis not present

## 2015-04-01 DIAGNOSIS — G43C0 Periodic headache syndromes in child or adult, not intractable: Secondary | ICD-10-CM

## 2015-04-01 DIAGNOSIS — E785 Hyperlipidemia, unspecified: Secondary | ICD-10-CM | POA: Diagnosis not present

## 2015-04-01 DIAGNOSIS — Z79899 Other long term (current) drug therapy: Secondary | ICD-10-CM

## 2015-04-01 LAB — COMPREHENSIVE METABOLIC PANEL
ALT: 23 U/L (ref 6–29)
AST: 20 U/L (ref 10–35)
Albumin: 4.2 g/dL (ref 3.6–5.1)
Alkaline Phosphatase: 72 U/L (ref 33–130)
BUN: 12 mg/dL (ref 7–25)
CHLORIDE: 102 mmol/L (ref 98–110)
CO2: 30 mmol/L (ref 20–31)
CREATININE: 1.01 mg/dL — AB (ref 0.50–0.99)
Calcium: 9.6 mg/dL (ref 8.6–10.4)
GLUCOSE: 102 mg/dL — AB (ref 65–99)
Potassium: 4.6 mmol/L (ref 3.5–5.3)
SODIUM: 138 mmol/L (ref 135–146)
Total Bilirubin: 0.8 mg/dL (ref 0.2–1.2)
Total Protein: 7 g/dL (ref 6.1–8.1)

## 2015-04-01 LAB — LIPID PANEL
CHOL/HDL RATIO: 2.6 ratio (ref <=5.0)
Cholesterol: 135 mg/dL (ref 125–200)
HDL: 52 mg/dL (ref >=46)
LDL CALC: 63 mg/dL (ref <130)
Triglycerides: 101 mg/dL (ref <150)
VLDL: 20 mg/dL (ref <30)

## 2015-04-01 LAB — TSH: TSH: 2.375 u[IU]/mL (ref 0.350–4.500)

## 2015-04-01 NOTE — Progress Notes (Signed)
OFFICE NOTE  Chief Complaint:  Routine follow-up  Primary Care Physician: No PCP Per Patient  HPI:  Susan Valencia is a 59 yo female with a history of palpitations, elevated BP, migraine headaches and soft flow murmur. After our first visit, I elected to start her on low dose propranolol XR 60 mg daily. She reported a marked improvement in her palpitations and almost a complete disappearance of her migraine headaches.  Since that time, she was getting some residual palpitations, only in the late evening. She then switched her medication to QHS dosing and noted an improvement. Today she is pretty much without complaints.  Susan Valencia returns today for follow-up. Overall she seems to be doing very well. She reports a propranolol works well for her palpitations and affect his done a good job of keeping her from having migraine headaches. She's had no side effects from Lipitor and is due for repeat testing. She denies any chest pain or worsening shortness of breath. Her only main concerns now are hot flashes and symptoms of being perimenopausal.  PMHx:  Past Medical History  Diagnosis Date  . Migraine     with menses  . Osteopenia 2006  . Osteoporosis 2006  . Pelvic mass   . Menorrhagia 2010  . Fibroid 2009  . Hydrosalpinx 2009  . Tachycardia 2013  . Systolic murmur 0000000  . ASCUS (atypical squamous cells of undetermined significance) on Pap smear 2009  . Palpitations   . Dyslipidemia     Past Surgical History  Procedure Laterality Date  . Hysteroscopic resection    . Cesarean section    . Transthoracic echocardiogram  07/22/2011    EF >55%, normal    FAMHx:  Family History  Problem Relation Age of Onset  . Cancer Mother     premenopausal breast   . Anuerysm Mother     abdominal and cerebral  . Cancer Sister     premenopausal breast  . Hypertension Father   . Heart disease Father 32  . Anuerysm Sister     SOCHx:   reports that she has never smoked. She has never  used smokeless tobacco. She reports that she does not drink alcohol or use illicit drugs.  ALLERGIES:  Allergies  Allergen Reactions  . Multihance [Gadobenate Dimeglumine] Nausea And Vomiting    Pt has nausea and vomiting after injection of multihance for mri.    ROS: A comprehensive review of systems was negative except for: Hot flashes  HOME MEDS: Current Outpatient Prescriptions  Medication Sig Dispense Refill  . alendronate (FOSAMAX) 70 MG tablet Take 70 mg by mouth every 7 (seven) days. Take with a full glass of water on an empty stomach.    Marland Kitchen atorvastatin (LIPITOR) 40 MG tablet Take 1 tablet (40 mg total) by mouth daily. 30 tablet 11  . BRISDELLE 7.5 MG CAPS Take 1 capsule by mouth daily.  1  . EVENING PRIMROSE OIL PO Take by mouth daily.    . IBUPROFEN PO Take by mouth as needed.    Marland Kitchen MAGNESIUM PO Take by mouth daily.    . Methylcellulose, Laxative, (CITRUCEL PO) Take by mouth.    . Multiple Vitamin (MULTIVITAMIN) tablet Take 1 tablet by mouth daily.    . propranolol ER (INDERAL LA) 60 MG 24 hr capsule Take 1 capsule (60 mg total) by mouth daily. 30 capsule 11   No current facility-administered medications for this visit.    LABS/IMAGING: No results found for this or  any previous visit (from the past 48 hour(s)). No results found.  VITALS: BP 128/90 mmHg  Pulse 66  Ht 5' 3.5" (1.613 m)  Wt 181 lb 1.6 oz (82.146 kg)  BMI 31.57 kg/m2  EXAM: General appearance: alert and no distress Lungs: clear to auscultation bilaterally Heart: regular rate and rhythm, S1, S2 normal, no murmur, click, rub or gallop Extremities: extremities normal, atraumatic, no cyanosis or edema Pulses: 2+ and symmetric  EKG: Normal sinus rhythm at 66  ASSESSMENT: 1. Palpitations - controlled on propranolol 2. Migraine headaches - improved with b-blocker 3. Dyslipidemia-stable on Lipitor  PLAN: 1.   Susan Valencia has almost total control of her palpitations on propranolol. This is helped her  significantly with her migraine headaches which are very infrequent. She's had good cholesterol control Lipitor and is due for repeat lipid profile. She does not currently have a primary care provider. We'll plan a screening lipid profile, compress a metabolic profile and TSH. I've encouraged her to reach out to some primary care providers to be established in addition to her GYN. Plan to see her back annually or sooner as necessary.  Pixie Casino, MD, [facility] Attending Cardiologist [facility]  Nadean Corwin  04/01/2015, 1:17 PM

## 2015-04-01 NOTE — Patient Instructions (Signed)
Your physician recommends that you return for lab work FASTING >> Dr. Debara Pickett is ordering labs to check your cholesterol, thyroid levels, metabolic panel (electrolytes, kidney/liver function).   Your physician wants you to follow-up in: 1 year with Dr. Debara Pickett. You will receive a reminder letter in the mail two months in advance. If you don't receive a letter, please call our office to schedule the follow-up appointment.

## 2015-04-19 ENCOUNTER — Other Ambulatory Visit: Payer: Self-pay | Admitting: Internal Medicine

## 2015-04-20 NOTE — Telephone Encounter (Signed)
Rx request sent to pharmacy.  

## 2015-06-02 ENCOUNTER — Telehealth: Payer: Self-pay | Admitting: Internal Medicine

## 2015-06-02 NOTE — Telephone Encounter (Signed)
Pt called in stating that the last time she was in the office, Dr. Debara Pickett gave her some suggested names for Primary Care doctors. She says that she did not write them down and would like to for him to give her another list. Please f/u with her  Thanks

## 2015-06-02 NOTE — Telephone Encounter (Signed)
Pt asking for Dr. Lysbeth Penner recommendation for PCP.

## 2015-06-03 NOTE — Telephone Encounter (Signed)
I suggested [facility]. Also, [facility] is a good group (Dr. Ardeth Perfect is taking patients). Could also try Dr. Ernie Hew on [address].  Dr. Lemmie Evens

## 2015-06-04 NOTE — Telephone Encounter (Signed)
Returned call, recommendations given to patient. She voiced understanding. She wanted to try Dr. Ernie Hew, I gave contact info for her office.

## 2016-04-06 ENCOUNTER — Ambulatory Visit (INDEPENDENT_AMBULATORY_CARE_PROVIDER_SITE_OTHER): Payer: PRIVATE HEALTH INSURANCE | Admitting: Internal Medicine

## 2016-04-06 ENCOUNTER — Encounter: Payer: Self-pay | Admitting: Internal Medicine

## 2016-04-06 VITALS — BP 132/84 | HR 60 | Ht 63.0 in | Wt 192.0 lb

## 2016-04-06 DIAGNOSIS — E785 Hyperlipidemia, unspecified: Secondary | ICD-10-CM | POA: Diagnosis not present

## 2016-04-06 DIAGNOSIS — I10 Essential (primary) hypertension: Secondary | ICD-10-CM | POA: Diagnosis not present

## 2016-04-06 DIAGNOSIS — G43009 Migraine without aura, not intractable, without status migrainosus: Secondary | ICD-10-CM | POA: Diagnosis not present

## 2016-04-06 DIAGNOSIS — R002 Palpitations: Secondary | ICD-10-CM

## 2016-04-06 LAB — COMPREHENSIVE METABOLIC PANEL
ALK PHOS: 59 U/L (ref 33–130)
ALT: 26 U/L (ref 6–29)
AST: 22 U/L (ref 10–35)
Albumin: 4.1 g/dL (ref 3.6–5.1)
BILIRUBIN TOTAL: 0.6 mg/dL (ref 0.2–1.2)
BUN: 9 mg/dL (ref 7–25)
CALCIUM: 9.4 mg/dL (ref 8.6–10.4)
CO2: 29 mmol/L (ref 20–31)
Chloride: 105 mmol/L (ref 98–110)
Creat: 1.04 mg/dL — ABNORMAL HIGH (ref 0.50–0.99)
GLUCOSE: 119 mg/dL — AB (ref 65–99)
POTASSIUM: 4.2 mmol/L (ref 3.5–5.3)
Sodium: 139 mmol/L (ref 135–146)
Total Protein: 6.6 g/dL (ref 6.1–8.1)

## 2016-04-06 LAB — LIPID PANEL
CHOL/HDL RATIO: 2.6 ratio (ref <5.0)
CHOLESTEROL: 130 mg/dL (ref <200)
HDL: 50 mg/dL — AB (ref >50)
LDL Cholesterol: 59 mg/dL (ref <100)
TRIGLYCERIDES: 103 mg/dL (ref <150)
VLDL: 21 mg/dL (ref <30)

## 2016-04-06 NOTE — Progress Notes (Signed)
OFFICE NOTE  Chief Complaint:  Routine follow-up  Primary Care Physician: Rachell Cipro, MD  HPI:  Susan Valencia is a 60 yo female with a history of palpitations, elevated BP, migraine headaches and soft flow murmur. After our first visit, I elected to start her on low dose propranolol XR 60 mg daily. She reported a marked improvement in her palpitations and almost a complete disappearance of her migraine headaches.  Since that time, she was getting some residual palpitations, only in the late evening. She then switched her medication to QHS dosing and noted an improvement. Today she is pretty much without complaints.  Susan Valencia returns today for follow-up. Overall she seems to be doing very well. She reports a propranolol works well for her palpitations and affect his done a good job of keeping her from having migraine headaches. She's had no side effects from Lipitor and is due for repeat testing. She denies any chest pain or worsening shortness of breath. Her only main concerns now are hot flashes and symptoms of being perimenopausal.  04/06/2016  Susan Valencia was seen today in follow-up. Overall she feels fairly well. She is still having some intermittent hot flashes. She says that her propranolol generally works for her palpitations although the other day she had palpitations mostly through one night. Her heart rate got up into the low 80s according to her fit but watch. She denies any fatigue with these episodes. Recently blood pressure was noted to be increased. Her primary care provider put her on low-dose losartan. This seems to be working for her. She is due for repeat testing of her lipid profile which was well controlled year ago. Weight is up about 11 pounds since her last office visit. She was recently found to be mildly hypothyroid and is on low-dose levothyroxine.  PMHx:  Past Medical History:  Diagnosis Date  . ASCUS (atypical squamous cells of undetermined significance)  on Pap smear 2009  . Dyslipidemia   . Fibroid 2009  . Hydrosalpinx 2009  . Menorrhagia 2010  . Migraine    with menses  . Osteopenia 2006  . Osteoporosis 2006  . Palpitations   . Pelvic mass   . Systolic murmur 0000000  . Tachycardia 2013    Past Surgical History:  Procedure Laterality Date  . CESAREAN SECTION    . hysteroscopic resection    . TRANSTHORACIC ECHOCARDIOGRAM  07/22/2011   EF >55%, normal    FAMHx:  Family History  Problem Relation Age of Onset  . Cancer Mother     premenopausal breast   . Anuerysm Mother     abdominal and cerebral  . Cancer Sister     premenopausal breast  . Hypertension Father   . Heart disease Father 66  . Anuerysm Sister     SOCHx:   reports that she has never smoked. She has never used smokeless tobacco. She reports that she does not drink alcohol or use drugs.  ALLERGIES:  Allergies  Allergen Reactions  . Multihance [Gadobenate Dimeglumine] Nausea And Vomiting    Pt has nausea and vomiting after injection of multihance for mri.    ROS: Pertinent items noted in HPI and remainder of comprehensive ROS otherwise negative.  HOME MEDS: Current Outpatient Prescriptions  Medication Sig Dispense Refill  . alendronate (FOSAMAX) 70 MG tablet Take 70 mg by mouth every 7 (seven) days. Take with a full glass of water on an empty stomach.    Marland Kitchen atorvastatin (LIPITOR) 40 MG tablet  take 1 tablet by mouth once daily 30 tablet 11  . EVENING PRIMROSE OIL PO Take by mouth daily.    . IBUPROFEN PO Take by mouth as needed.    Marland Kitchen levothyroxine (SYNTHROID, LEVOTHROID) 25 MCG tablet Take 1 tablet by mouth daily.  0  . losartan (COZAAR) 50 MG tablet Take 1 tablet by mouth daily.  0  . MAGNESIUM PO Take by mouth daily.    . Methylcellulose, Laxative, (CITRUCEL PO) Take by mouth.    . Multiple Vitamin (MULTIVITAMIN) tablet Take 1 tablet by mouth daily.    . propranolol ER (INDERAL LA) 60 MG 24 hr capsule take 1 capsule by mouth once daily 30 capsule 11    . levothyroxine (SYNTHROID, LEVOTHROID) 25 MCG tablet Take 1 tablet by mouth daily.  0  . losartan (COZAAR) 50 MG tablet Take 1 tablet by mouth daily.  0   No current facility-administered medications for this visit.     LABS/IMAGING: No results found for this or any previous visit (from the past 48 hour(s)). No results found.  VITALS: BP 132/84   Pulse 60   Ht 5\' 3"  (1.6 m)   Wt 192 lb (87.1 kg)   BMI 34.01 kg/m   EXAM: General appearance: alert and no distress Lungs: clear to auscultation bilaterally Heart: regular rate and rhythm, S1, S2 normal, no murmur, click, rub or gallop Extremities: extremities normal, atraumatic, no cyanosis or edema Pulses: 2+ and symmetric  EKG: Normal sinus rhythm at 60  ASSESSMENT: 1. Palpitations - controlled on propranolol 2. Migraine headaches - improved with b-blocker 3. Dyslipidemia-stable on Lipitor 4. Essential hypertension 5. Hypothyroidism  PLAN: 1.   Susan Valencia reports continued benefit on propranolol with her palpitations although she had one episode of breakthrough the other evening which lasted most of the night. She monitors her heart rate with her fit but watch. She reports marked improvement in her migraine headaches. He developed some mild essential hypertension which is improved on losartan. She is due for repeat lipid profile which I'll order today. She is also being treated for mild hypothyroidism by primary care provider.  Follow-up annually or sooner as necessary.  Pixie Casino, MD, [facility] Attending Cardiologist [place] 04/06/2016, 9:10 AM

## 2016-04-06 NOTE — Patient Instructions (Signed)
Medication Instructions:  Your physician recommends that you continue on your current medications as directed. Please refer to the Current Medication list given to you today.  Labwork: Your physician recommends that you return for lab work in: [place], LIPIDS  Testing/Procedures: NONE   Follow-Up: Your physician wants you to follow-up in: [place]. You will receive a reminder letter in the mail two months in advance. If you don't receive a letter, please call our office to schedule the follow-up appointment.  Any Other Special Instructions Will Be Listed Below (If Applicable).     If you need a refill on your cardiac medications before your next appointment, please call your pharmacy.

## 2016-04-10 ENCOUNTER — Other Ambulatory Visit: Payer: Self-pay | Admitting: Internal Medicine

## 2016-04-11 NOTE — Telephone Encounter (Signed)
Rx(s) sent to pharmacy electronically.  

## 2016-10-18 DIAGNOSIS — Z0000 Encounter for general adult medical examination without abnormal findings: Secondary | ICD-10-CM | POA: Diagnosis not present

## 2016-10-21 DIAGNOSIS — E782 Mixed hyperlipidemia: Secondary | ICD-10-CM | POA: Diagnosis not present

## 2016-10-21 DIAGNOSIS — R739 Hyperglycemia, unspecified: Secondary | ICD-10-CM | POA: Diagnosis not present

## 2016-10-21 DIAGNOSIS — I10 Essential (primary) hypertension: Secondary | ICD-10-CM | POA: Diagnosis not present

## 2016-10-21 DIAGNOSIS — E039 Hypothyroidism, unspecified: Secondary | ICD-10-CM | POA: Diagnosis not present

## 2016-10-26 DIAGNOSIS — Z23 Encounter for immunization: Secondary | ICD-10-CM | POA: Diagnosis not present

## 2016-10-26 DIAGNOSIS — Z1211 Encounter for screening for malignant neoplasm of colon: Secondary | ICD-10-CM | POA: Diagnosis not present

## 2016-10-26 DIAGNOSIS — R002 Palpitations: Secondary | ICD-10-CM | POA: Diagnosis not present

## 2016-10-26 DIAGNOSIS — Z0000 Encounter for general adult medical examination without abnormal findings: Secondary | ICD-10-CM | POA: Diagnosis not present

## 2016-10-26 DIAGNOSIS — Z6834 Body mass index (BMI) 34.0-34.9, adult: Secondary | ICD-10-CM | POA: Diagnosis not present

## 2016-11-10 DIAGNOSIS — R3915 Urgency of urination: Secondary | ICD-10-CM | POA: Diagnosis not present

## 2016-11-10 DIAGNOSIS — M810 Age-related osteoporosis without current pathological fracture: Secondary | ICD-10-CM | POA: Diagnosis not present

## 2016-11-10 DIAGNOSIS — Z01411 Encounter for gynecological examination (general) (routine) with abnormal findings: Secondary | ICD-10-CM | POA: Diagnosis not present

## 2016-11-10 DIAGNOSIS — D259 Leiomyoma of uterus, unspecified: Secondary | ICD-10-CM | POA: Diagnosis not present

## 2016-11-14 DIAGNOSIS — M810 Age-related osteoporosis without current pathological fracture: Secondary | ICD-10-CM | POA: Diagnosis not present

## 2016-12-27 DIAGNOSIS — I10 Essential (primary) hypertension: Secondary | ICD-10-CM | POA: Diagnosis not present

## 2016-12-27 DIAGNOSIS — Z23 Encounter for immunization: Secondary | ICD-10-CM | POA: Diagnosis not present

## 2017-02-07 ENCOUNTER — Telehealth: Payer: Self-pay | Admitting: Internal Medicine

## 2017-02-07 DIAGNOSIS — R002 Palpitations: Secondary | ICD-10-CM

## 2017-02-07 NOTE — Telephone Encounter (Signed)
°  New Prob  Patient c/o Palpitations:  High priority if patient c/o lightheadedness, shortness of breath, or chest pain  1) How long have you had palpitations/irregular HR/ Afib? Are you having the symptoms now? 1.5-2 weeks  2) Are you currently experiencing lightheadedness, SOB or CP? No  3) Do you have a history of afib (atrial fibrillation) or irregular heart rhythm? Yes  4) Have you checked your BP or HR? (document readings if available): Yes, but did not write them down  5) Are you experiencing any other symptoms? No

## 2017-02-07 NOTE — Telephone Encounter (Signed)
Returned call to pt c/o palpitations and pounding in her chest only when she lies down at night that makes it hard to go to sleep this has been happening for the last 2 weeks . HR on fitbit at this time is in the 90's. Denies lightheadedness, shortness of breath, chest pain or pressure, nausea or vomiting or any other sx. Her BP is "fine" running as normal 114/62 HR 73. Verified that pt is taking her propranolol and all her medications as ordered. She is concerned with the palpitations. Pt notified that Dr Debara Pickett is at the hospital this week and we will call back when he responds. Please advise

## 2017-02-08 NOTE — Telephone Encounter (Signed)
Pt notified, she agrees to do testing, and will await scheduling call  Monitor ordered for palpitations and message sent to scheduling to call pt to schedule these appts

## 2017-02-08 NOTE — Telephone Encounter (Signed)
Please order a 48 hour Holter monitor and arrange for a follow-up appointment with me afterwards. Ok to [place].  Dr. Lemmie Evens

## 2017-02-09 NOTE — Telephone Encounter (Signed)
Patient scheduled for monitor app on 02/21/17 and MD OV 03/07/17

## 2017-02-21 ENCOUNTER — Ambulatory Visit (INDEPENDENT_AMBULATORY_CARE_PROVIDER_SITE_OTHER): Payer: BLUE CROSS/BLUE SHIELD

## 2017-02-21 DIAGNOSIS — R002 Palpitations: Secondary | ICD-10-CM | POA: Diagnosis not present

## 2017-03-07 ENCOUNTER — Ambulatory Visit (INDEPENDENT_AMBULATORY_CARE_PROVIDER_SITE_OTHER): Payer: BLUE CROSS/BLUE SHIELD | Admitting: Internal Medicine

## 2017-03-07 ENCOUNTER — Encounter: Payer: Self-pay | Admitting: Internal Medicine

## 2017-03-07 VITALS — BP 130/82 | HR 70 | Ht 63.0 in | Wt 192.8 lb

## 2017-03-07 DIAGNOSIS — R002 Palpitations: Secondary | ICD-10-CM

## 2017-03-07 DIAGNOSIS — I10 Essential (primary) hypertension: Secondary | ICD-10-CM | POA: Diagnosis not present

## 2017-03-07 DIAGNOSIS — G43C0 Periodic headache syndromes in child or adult, not intractable: Secondary | ICD-10-CM | POA: Diagnosis not present

## 2017-03-07 NOTE — Patient Instructions (Signed)
Your physician wants you to follow-up in: ONE YEAR with Dr. Hilty. You will receive a reminder letter in the mail two months in advance. If you don't receive a letter, please call our office to schedule the follow-up appointment.  

## 2017-03-07 NOTE — Progress Notes (Signed)
OFFICE NOTE  Chief Complaint:  Routine follow-up  Primary Care Physician: Fanny Bien, MD  HPI:  Susan Valencia is a 61 yo female with a history of palpitations, elevated BP, migraine headaches and soft flow murmur. After our first visit, I elected to start her on low dose propranolol XR 60 mg daily. She reported a marked improvement in her palpitations and almost a complete disappearance of her migraine headaches.  Since that time, she was getting some residual palpitations, only in the late evening. She then switched her medication to QHS dosing and noted an improvement. Today she is pretty much without complaints.  Susan Valencia returns today for follow-up. Overall she seems to be doing very well. She reports a propranolol works well for her palpitations and affect his done a good job of keeping her from having migraine headaches. She's had no side effects from Lipitor and is due for repeat testing. She denies any chest pain or worsening shortness of breath. Her only main concerns now are hot flashes and symptoms of being perimenopausal.  04/06/2016  Susan Valencia was seen today in follow-up. Overall she feels fairly well. She is still having some intermittent hot flashes. She says that her propranolol generally works for her palpitations although the other day she had palpitations mostly through one night. Her heart rate got up into the low 80s according to her fit but watch. She denies any fatigue with these episodes. Recently blood pressure was noted to be increased. Her primary care provider put her on low-dose losartan. This seems to be working for her. She is due for repeat testing of her lipid profile which was well controlled year ago. Weight is up about 11 pounds since her last office visit. She was recently found to be mildly hypothyroid and is on low-dose levothyroxine.  03/08/2017  Susan Valencia returns today for follow-up.  She reports some occasional palpitations that are  breaking through her propranolol.  She has well-controlled TSH and is on alternating levothyroxine 50/25 mcg every other day.  She also takes atorvastatin and losartan.  Blood pressures well controlled today at 130/82.  Thyroid and cholesterol followed by her primary care provider and were performed recently.  PMHx:  Past Medical History:  Diagnosis Date  . ASCUS (atypical squamous cells of undetermined significance) on Pap smear 2009  . Dyslipidemia   . Fibroid 2009  . Hydrosalpinx 2009  . Menorrhagia 2010  . Migraine    with menses  . Osteopenia 2006  . Osteoporosis 2006  . Palpitations   . Pelvic mass   . Systolic murmur 0240  . Tachycardia 2013    Past Surgical History:  Procedure Laterality Date  . CESAREAN SECTION    . hysteroscopic resection    . TRANSTHORACIC ECHOCARDIOGRAM  07/22/2011   EF >55%, normal    FAMHx:  Family History  Problem Relation Age of Onset  . Cancer Mother        premenopausal breast   . Anuerysm Mother        abdominal and cerebral  . Cancer Sister        premenopausal breast  . Hypertension Father   . Heart disease Father 71  . Anuerysm Sister     SOCHx:   reports that she has never smoked. She has never used smokeless tobacco. She reports that she does not drink alcohol or use drugs.  ALLERGIES:  Allergies  Allergen Reactions  . Multihance [Gadobenate Dimeglumine] Nausea And Vomiting  Pt has nausea and vomiting after injection of multihance for mri.    ROS: Pertinent items noted in HPI and remainder of comprehensive ROS otherwise negative.  HOME MEDS: Current Outpatient Prescriptions  Medication Sig Dispense Refill  . alendronate (FOSAMAX) 70 MG tablet Take 70 mg by mouth every 7 (seven) days. Take with a full glass of water on an empty stomach.    Marland Kitchen atorvastatin (LIPITOR) 40 MG tablet take 1 tablet by mouth once daily 30 tablet 11  . Calcium Carbonate-Vitamin D (CALCIUM 500 + D PO) Take 2 tablets by mouth daily.    Marland Kitchen  EVENING PRIMROSE OIL PO Take by mouth daily.    . IBUPROFEN PO Take by mouth as needed.    Marland Kitchen levothyroxine (SYNTHROID, LEVOTHROID) 25 MCG tablet Take 1 tablet by mouth daily.  0  . losartan (COZAAR) 50 MG tablet Take 1 tablet by mouth daily.  0  . MAGNESIUM PO Take by mouth daily.    . Multiple Vitamin (MULTIVITAMIN) tablet Take 1 tablet by mouth daily.    . propranolol ER (INDERAL LA) 60 MG 24 hr capsule take 1 capsule by mouth once daily 30 capsule 11   No current facility-administered medications for this visit.     LABS/IMAGING: No results found for this or any previous visit (from the past 48 hour(s)). No results found.  VITALS: BP 130/82   Pulse 70   Ht 5\' 3"  (1.6 m)   Wt 192 lb 12.8 oz (87.5 kg)   BMI 34.15 kg/m   EXAM: General appearance: alert and no distress Lungs: clear to auscultation bilaterally Heart: regular rate and rhythm, S1, S2 normal, no murmur, click, rub or gallop Extremities: extremities normal, atraumatic, no cyanosis or edema Pulses: 2+ and symmetric  EKG: Deferred  ASSESSMENT: 1. Palpitations - mostly controlled on propranolol 2. Migraine headaches - improved with b-blocker 3. Dyslipidemia-stable on Lipitor 4. Essential hypertension 5. Hypothyroidism  PLAN: 1.   [name] is generally good control of her palpitations on propranolol.  She has some occasional breakthrough.  She may need a slight increase in her medication dose but will track her symptoms going forward.  I asked her to watch caffeine intake, lower stress and improve her sleep hygiene which should help as well.    She can contact me if she needs further medication adjustments otherwise plan to see her back annually or sooner as necessary.    Pixie Casino, MD, [facility] Attending Cardiologist [place] C Hilty 03/07/2017, 9:43 AM

## 2017-04-07 ENCOUNTER — Ambulatory Visit: Payer: PRIVATE HEALTH INSURANCE | Admitting: Internal Medicine

## 2017-04-11 ENCOUNTER — Other Ambulatory Visit: Payer: Self-pay | Admitting: Internal Medicine

## 2017-04-25 DIAGNOSIS — Z23 Encounter for immunization: Secondary | ICD-10-CM | POA: Diagnosis not present

## 2017-05-07 ENCOUNTER — Other Ambulatory Visit: Payer: Self-pay | Admitting: Internal Medicine

## 2017-05-12 ENCOUNTER — Ambulatory Visit (INDEPENDENT_AMBULATORY_CARE_PROVIDER_SITE_OTHER): Payer: BLUE CROSS/BLUE SHIELD | Admitting: Internal Medicine

## 2017-05-12 DIAGNOSIS — Z9189 Other specified personal risk factors, not elsewhere classified: Secondary | ICD-10-CM

## 2017-05-12 DIAGNOSIS — Z23 Encounter for immunization: Secondary | ICD-10-CM

## 2017-05-12 DIAGNOSIS — Z789 Other specified health status: Secondary | ICD-10-CM

## 2017-05-12 DIAGNOSIS — Z7189 Other specified counseling: Secondary | ICD-10-CM

## 2017-05-12 DIAGNOSIS — Z7184 Encounter for health counseling related to travel: Secondary | ICD-10-CM | POA: Insufficient documentation

## 2017-05-12 MED ORDER — ATOVAQUONE-PROGUANIL HCL 250-100 MG PO TABS: 1.0000 | ORAL_TABLET | Freq: Every day | ORAL | 0 refills | Status: DC

## 2017-05-12 MED ORDER — CIPROFLOXACIN HCL 500 MG PO TABS: 500.0000 mg | ORAL_TABLET | Freq: Two times a day (BID) | ORAL | 0 refills | Status: DC

## 2017-05-12 NOTE — Patient Instructions (Signed)
[facility]                [address], [place]                   [place], [place] [address]                      Phone: [phone]                        Fax: [phone]   Planned departure date: September 9         Planned return date: 2 weeks Countries of travel: [place], [place] and [place]   Guidelines for the Prevention & Treatment of Traveler's Diarrhea  Prevention: "Boil it, Peel it, [place] it, or Forget it"   the fewer chances -> lower risk: try to stick to food & water precautions as much as possible"   If it's "piping hot"; it is probably okay, if not, it may not be   Treatment   1) You should always take care to drink lots of fluids in order to avoid dehydration   2) You should bring medications with you in case you come down with a case of diarrhea   3) OTC = bring pepto-bismol - can take with initial abdominal symptoms;                    Imodium - can help slow down your intestinal tract, can help relief cramps                    and diarrhea, can take if no bloody diarrhea  Use ciprofloxacin if needed for traveler's diarrhea  Guidelines for the Prevention of Malaria  Avoidance:  -fewer mosquito bites = lower risk. Mosquitos can bite at night as well as daytime  -cover up (long sleeve clothing), mosquito nets, screens  -Insect repellent for your skin ( DEET containing lotion > 20%): for clothes ( permethrin spray)   2 days prior to entering [place] NP start malarone, daily dose starting 1-2 days before entering endemic area, ending 7 days after leaving area for malaria prevention.   Immunizations received today: Typhoid (parenteral)  Future immunizations, if indicated none indicated   Prior to travel:  1) Be sure to pick up appropriate prescriptions, including medicine you take daily. Do not expect to be able to fill your prescriptions abroad.  2) Strongly consider obtaining traveler's insurance,  including emergency evacuation insurance. Most plans in the [place] do not cover participants abroad. (see below for resources)  3) Register at the appropriate [place] embassy or consulate with travel dates so they are aware of your presence in-country and for helpful advice during travel using the [employer] (STEP, GreenNylon.com.cy).  4) Leave contact information with a relative or friend.  5) Keep a Research officer, political party, credit cards in case they become lost or stolen  6) Inform your credit card company that you will be travelling abroad   During travel:  1) If you become ill and need medical advice, the [place] [employer]	[place] of the country you are traveling in general provides a list of [place] speaking doctors.  We are also available on MyChart for remote consultation if you register prior to travel. 2) Avoid motorcycles or scooters when at all possible. Traffic laws in many countries are lax and accidents occur frequently.  3) Do not take any unnecessary risks that you wouldn't do at home.   Resources:  -Country specific information: BlindResource.ca or GreenNylon.com.cy  -Press photographer (DEET, mosquito nets): [employer], [employer] store, [employer], [place] insurance options: gatewayplans.com; http://clayton-rivera.info/; travelguard.com or Good [employer], gninsurance.com or info@gninsurance .com, [id number].   Post Travel:  If you return from your trip ill, call your primary care doctor or our travel clinic @ [phone].   Enjoy your trip and know that with proper pre-travel preparation, most people have an enjoyable and uninterrupted trip!

## 2017-05-15 NOTE — Progress Notes (Signed)
Subjective:   Susan Valencia is a 62 y.o. female who presents to the Infectious Disease clinic for travel consultation. Planned departure date: January 15, 2018          Planned return date: 2 weeks Countries of travel: [place], [place] and [place] Areas in country: urban   Accommodations: safari Purpose of travel: vacation Prior travel out of [place]: yes     Objective:   Medications: reviewed    Assessment:   No contraindications to travel. none     Plan:    Issues discussed: environmental concerns, future shots, jet lag, malaria, MVA safety, rabies, safe food/water, traveler's diarrhea, website/handouts for more information, what to do if ill upon return, what to do if ill while there and Yellow Fever. Immunizations recommended: Typhoid (parenteral). Malaria prophylaxis: malarone, daily dose starting 1-2 days before entering endemic area, ending 7 days after leaving area Traveler's diarrhea prophylaxis: ciprofloxacin. Total duration of visit: 1 Hour. Total time spent on education, counseling, coordination of care: 30 Minutes.

## 2017-06-02 ENCOUNTER — Other Ambulatory Visit: Payer: Self-pay | Admitting: Internal Medicine

## 2017-06-02 NOTE — Telephone Encounter (Signed)
REFILL 

## 2017-06-26 DIAGNOSIS — R946 Abnormal results of thyroid function studies: Secondary | ICD-10-CM | POA: Diagnosis not present

## 2017-06-28 DIAGNOSIS — I10 Essential (primary) hypertension: Secondary | ICD-10-CM | POA: Diagnosis not present

## 2017-06-28 DIAGNOSIS — E039 Hypothyroidism, unspecified: Secondary | ICD-10-CM | POA: Diagnosis not present

## 2017-06-28 DIAGNOSIS — G4700 Insomnia, unspecified: Secondary | ICD-10-CM | POA: Diagnosis not present

## 2017-06-28 DIAGNOSIS — Z6834 Body mass index (BMI) 34.0-34.9, adult: Secondary | ICD-10-CM | POA: Diagnosis not present

## 2017-07-25 DIAGNOSIS — Z1231 Encounter for screening mammogram for malignant neoplasm of breast: Secondary | ICD-10-CM | POA: Diagnosis not present

## 2017-07-25 DIAGNOSIS — Z803 Family history of malignant neoplasm of breast: Secondary | ICD-10-CM | POA: Diagnosis not present

## 2017-07-26 DIAGNOSIS — G4700 Insomnia, unspecified: Secondary | ICD-10-CM | POA: Diagnosis not present

## 2017-07-26 DIAGNOSIS — R002 Palpitations: Secondary | ICD-10-CM | POA: Diagnosis not present

## 2017-07-26 DIAGNOSIS — I10 Essential (primary) hypertension: Secondary | ICD-10-CM | POA: Diagnosis not present

## 2017-09-13 ENCOUNTER — Encounter: Payer: Self-pay | Admitting: Neurology

## 2017-09-14 ENCOUNTER — Ambulatory Visit (INDEPENDENT_AMBULATORY_CARE_PROVIDER_SITE_OTHER): Payer: BLUE CROSS/BLUE SHIELD | Admitting: Neurology

## 2017-09-14 ENCOUNTER — Encounter: Payer: Self-pay | Admitting: Neurology

## 2017-09-14 VITALS — BP 133/71 | HR 67 | Ht 63.0 in | Wt 192.0 lb

## 2017-09-14 DIAGNOSIS — G4719 Other hypersomnia: Secondary | ICD-10-CM | POA: Diagnosis not present

## 2017-09-14 DIAGNOSIS — E039 Hypothyroidism, unspecified: Secondary | ICD-10-CM | POA: Diagnosis not present

## 2017-09-14 DIAGNOSIS — R5383 Other fatigue: Secondary | ICD-10-CM

## 2017-09-14 DIAGNOSIS — R0683 Snoring: Secondary | ICD-10-CM

## 2017-09-14 DIAGNOSIS — G43911 Migraine, unspecified, intractable, with status migrainosus: Secondary | ICD-10-CM | POA: Diagnosis not present

## 2017-09-14 NOTE — Progress Notes (Signed)
SLEEP MEDICINE CLINIC   Provider:  Larey Seat, M D  Primary Care Physician:  Susan Bien, MD   Referring Provider: Fanny Bien, MD    Chief Complaint  Patient presents with  . New Patient (Initial Visit)    pt alone, rm 11. pt states that she has trouble with her sleep. states that she has been told she snores in her sleep. denies apnea. denies extra fatigue during the day. pt states she may have avg of 5 hours uninterupted sleep.    HPI:  Susan Valencia is a 62 y.o. female with african american and cherokee ancestry( paternal grandmother was full blood). , seen here as in a referral from Susan. Ernie Valencia for an evaluation of possible sleep apnea. Husband Susan Valencia has witnessed her to snore but has not commented on apnea.  Chief complaint according to patient : she wakes up from her own snoring. Snoring seems to be prresent over the last 3-4 years, she had a septo-naso plasty but that made it worse ! She has a dry mouth, but not dry eyes. She is unaware of any allergies.   Sleep habits are as follows: Susan Valencia reports that she usually reads before she goes to bed, sometimes she will watch TV but not in the bedroom. She often goes late, between 1-2 AM.  Rarely does she read on the iPad she is usually a liter in an old fashion with pages.  Sometimes she has trouble to go to sleep and she has tried over-the-counter aids such as antihistamines, ZzzQuil, but she also suffers from a dry mouth which can get worse from it. She sleeps or her side, on many pillows, difficult to find comfort  She wakes up from vivid dreams- every night and all night. Not nightmares but lucid.  She often doesn't remember her dreams. She wakes some nights with palpitations, which she has attributed to thyroid disease. Hot flushes are present, too.  Rises at 8.30 AM, husband rises at 5 AM ( Uro-surgeon). Often she sleeps in a different room. She looked at her fit-bit and averages 5.5 hours.   Sleep medical  history and family sleep history: thyroid disease, related weight gain, cold intolerance, hot flushes with menopause. 2 sisters have hypothyroidism. No goiter. One sister has insomnia. She had 4 sisters and 2 brothers.  Weight gain with menopause 4-5 years. Slowly progressive .   Social history: married,  2 adult daughters,  No grandchildren. She worked for [employer]. Non smoker, parents were smokers.  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   Review of Systems: Out of a complete 14 system review, the patient complains of only the following symptoms, and all other reviewed systems are negative.   Epworth score 8, Fatigue severity score 15  , depression score n/a    Social History   Socioeconomic History  . Marital status: Married    Spouse name: Not on file  . Number of children: Not on file  . Years of education: Not on file  . Highest education level: Not on file  Occupational History  . Not on file  Social Needs  . Financial resource strain: Not on file  . Food insecurity:    Worry: Not on file    Inability: Not on file  . Transportation needs:    Medical: Not on file    Non-medical: Not on  file  Tobacco Use  . Smoking status: Never Smoker  . Smokeless tobacco: Never Used  Substance and Sexual Activity  . Alcohol use: Yes    Comment: occasional/ maybe 4 glasses of wine a year  . Drug use: No  . Sexual activity: Yes    Birth control/protection: Post-menopausal  Lifestyle  . Physical activity:    Days per week: Not on file    Minutes per session: Not on file  . Stress: Not on file  Relationships  . Social connections:    Talks on phone: Not on file    Gets together: Not on file    Attends religious service: Not on file    Active member of club or organization: Not on file    Attends meetings of clubs or organizations: Not on file    Relationship status: Not on file  .  Intimate partner violence:    Fear of current or ex partner: Not on file    Emotionally abused: Not on file    Physically abused: Not on file    Forced sexual activity: Not on file  Other Topics Concern  . Not on file  Social History Narrative  . Not on file    Family History  Problem Relation Age of Onset  . Cancer Mother        premenopausal breast   . Anuerysm Mother        abdominal and cerebral  . Cancer Sister        premenopausal breast  . Hypertension Father   . Heart disease Father 31  . Anuerysm Sister     Past Medical History:  Diagnosis Date  . ASCUS (atypical squamous cells of undetermined significance) on Pap smear 2009  . Daytime sleepiness   . Dyslipidemia   . Fibroid 2009  . Hydrosalpinx 2009  . Hyperlipidemia   . Hypertension   . Hypothyroidism   . Menorrhagia 2010  . Migraine    with menses  . Osteopenia 2006  . Osteoporosis 2006  . Palpitations   . Pelvic mass   . Snoring   . Systolic murmur 9798  . Tachycardia 2013    Past Surgical History:  Procedure Laterality Date  . CESAREAN SECTION    . hysteroscopic resection    . TRANSTHORACIC ECHOCARDIOGRAM  07/22/2011   EF >55%, normal    Current Outpatient Medications  Medication Sig Dispense Refill  . alendronate (FOSAMAX) 70 MG tablet Take 70 mg by mouth every 7 (seven) days. Take with a full glass of water on an empty stomach.    Marland Kitchen atorvastatin (LIPITOR) 40 MG tablet TAKE 1 TABLET BY MOUTH EVERY DAY 30 tablet 11  . atovaquone-proguanil (MALARONE) 250-100 MG TABS tablet Take 1 tablet by mouth daily. Start 2 days prior to travel to malaria area, throughout travel and for 7 days upon return. 23 tablet 0  . Calcium Carbonate-Vitamin D (CALCIUM 500 + D PO) Take 2 tablets by mouth daily.    . ciprofloxacin (CIPRO) 500 MG tablet Take 1 tablet (500 mg total) by mouth 2 (two) times daily. Take 2-3 days until diarrhea resolves 8 tablet 0  . EVENING PRIMROSE OIL PO Take by mouth daily.    . IBUPROFEN  PO Take by mouth as needed.    Marland Kitchen levothyroxine (SYNTHROID, LEVOTHROID) 25 MCG tablet Take 1-2 tablets by mouth daily.   0  . losartan (COZAAR) 50 MG tablet Take 1 tablet by mouth daily.  0  . MAGNESIUM PO  Take by mouth daily.    . Multiple Vitamin (MULTIVITAMIN) tablet Take 1 tablet by mouth daily.    . propranolol ER (INDERAL LA) 60 MG 24 hr capsule TAKE ONE CAPSULE BY MOUTH EVERY DAY 30 capsule 11   No current facility-administered medications for this visit.     Allergies as of 09/14/2017 - Review Complete 09/14/2017  Allergen Reaction Noted  . Multihance [gadobenate dimeglumine] Nausea And Vomiting 06/28/2010    Vitals: BP 133/71   Pulse 67   Ht 5\' 3"  (1.6 m)   Wt 192 lb (87.1 kg)   BMI 34.01 kg/m  Last Weight:  Wt Readings from Last 1 Encounters:  09/14/17 192 lb (87.1 kg)   DPO:EUMP mass index is 34.01 kg/m.     Last Height:   Ht Readings from Last 1 Encounters:  09/14/17 5\' 3"  (1.6 m)    Physical exam:  General: The patient is awake, alert and appears not in acute distress. The patient is well groomed. Head: Normocephalic, atraumatic. Neck is supple. Mallampati 5 - uvula not visible.   neck circumference:16 inches . Nasal airflow while siting is patent  Nasal septum is straight., Retrognathia is not seen.  Cardiovascular:  Regular rate and rhythm, without  murmurs or carotid bruit, and without distended neck veins. Respiratory: Lungs are clear to auscultation. Skin:  Without evidence of edema, or rash Trunk: BMI is elevated. The patient's posture is erect.   Neurologic exam : The patient is awake and alert, oriented to place and time.   Memory subjective  described as intact.  Attention span & concentration ability appears normal.  Speech is fluent, without dysarthria, dysphonia or aphasia.  Mood and affect are appropriate.  Cranial nerves: Pupils are equal and briskly reactive to light.  Funduscopic exam without evidence of pallor or edema.  Extraocular  movements  in vertical and horizontal planes intact and without nystagmus. Visual fields by finger perimetry are intact. Hearing to finger rub intact. Facial sensation intact to fine touch. Facial motor strength is symmetric and tongue and uvula move midline. Shoulder shrug was symmetrical.   Motor exam:   Normal tone, muscle bulk and symmetric strength in all extremities. She has very tight shoulders, and neck muscles.  Sensory:  Fine touch, pinprick and vibration were tested in all extremities. Proprioception tested in the upper extremities was normal. Coordination: Rapid alternating movements in the fingers/hands was normal. Finger-to-nose maneuver  normal without evidence of ataxia, dysmetria or tremor.  Gait and station: Patient walks without assistive device and is able unassisted to climb up to the exam table. Strength within normal limits.  Stance is stable and normal.  Toe and hell stand were tested .Tandem gait is unfragmented. Turns with  3 Steps.  Deep tendon reflexes: in the  upper and lower extremities are symmetric and intact. Babinski maneuver response is downgoing.    Assessment:  After physical and neurologic examination, review of laboratory studies,  Personal review of imaging studies, reports of other /same  Imaging studies, results of polysomnography and / or neurophysiology testing and pre-existing records as far as provided in visit., my assessment is   1)  Split night polysomnography - this patient snores more since menopause and thyroid disease , and she has gained weight.  The body mass index, the 16 inch neck circumference and the high-grade Mallampati all are contributing risk factors for the presence of obstructive sleep apnea.  There is also difficulties to initiate sleep and some nights and she has  used antihistamines to help with that.  I suggested that we will try melatonin at 5 mg or less nightly and she should probably take it about an hour before bedtime.  I also  encouraged some sleep routines to change I would like her to advance her bedtime preferably before midnight.  Establish a routine or even ritual that facilitates the onset of sleep.  I will ask her to abandon all electronics 30 minutes before bedtime and not to bring them into the bedroom.  Should be no screen light or blue frequency light.  If she wakes up and wants to do something to get her mind off in order to follow easier back to sleep she should read in a book old-fashioned with pages and use an old incandescent light bulb with a more golden globe.  LED lights also suppressed melatonin production.  I am little worried that the tension in her neck may also interfere with her sleep and with her quality of sleep.  If we find that the patient does not have obstructive sleep apnea I would like to treat this with a mild muscle relaxer. She has rare migraines these days, triggered by sodium,   The patient was advised of the nature of the diagnosed disorder , the treatment options and the  risks for general health and wellness arising from not treating the condition.   I spent more than 45 minutes of face to face time with the patient.  Greater than 50% of time was spent in counseling and coordination of care. We have discussed the diagnosis and differential and I answered the patient's questions.    Plan:  Treatment plan and additional workup :  SPLIT at 3 5 and AHI 20.  No CO2.   Larey Seat, MD 0/06/1113, 5:20 PM  Certified in Neurology by ABPN Certified in [place] by [facility] [address], [place], [place] [address]

## 2017-09-14 NOTE — Patient Instructions (Signed)
Hypersomnia Hypersomnia is when you feel extremely tired during the day even though you're getting plenty of sleep at night. You may need to take naps during the day, and you may also be extremely difficult to wake up when you are sleeping. What are the causes? The cause of your hypersomnia may not be known. Hypersomnia may be caused by:  Medicines.  Sleep disorders, such as narcolepsy.  Trauma or injury to your head or nervous system.  Using drugs or alcohol.  Tumors.  Medical conditions, such as depression or hypothyroidism.  Genetics.  What are the signs or symptoms? The main symptoms of hypersomnia include:  Feeling extremely tired throughout the day.  Being very difficult to wake up.  Sleeping for longer and longer periods.  Taking naps throughout the day.  Other symptoms may include:  Feeling: ? Restless. ? Annoyed. ? Anxious. ? Low energy.  Having difficulty: ? Remembering. ? Speaking. ? Thinking.  Losing your appetite.  Experiencing hallucinations.  How is this diagnosed? Hypersomnia may be diagnosed by:  Medical history and physical exam. This will include a sleep history.  Completing sleep logs.  Tests may also be done, such as: ? Polysomnography. ? Multiple sleep latency test (MSLT).  How is this treated? There is no cure for hypersomnia, but treatment can be very effective in helping manage the condition. Treatment may include:  Lifestyle and sleeping strategies to help cope with the condition.  Stimulant medicines.  Treating any underlying causes of hypersomnia.  Follow these instructions at home:  Take medicines only as directed by your health care provider.  Schedule short naps for when you feel sleepiest during the day. Tell your employer or teachers that you have hypersomnia. You may be able to adjust your schedule to include time for naps.  Avoid drinking alcohol or caffeinated beverages.  Do not eat a heavy meal before  bedtime. Eat at about the same times every day.  Do not drive or operate heavy machinery if you are sleepy.  Do not swim or go out on the water without a life jacket.  If possible, adjust your schedule so that you do not have to work or be active at night.  Keep all follow-up visits as directed by your health care provider. This is important. Contact a health care provider if:  You have new symptoms.  Your symptoms get worse. Get help right away if: You have serious thoughts of hurting yourself or someone else. This information is not intended to replace advice given to you by your health care provider. Make sure you discuss any questions you have with your health care provider. Document Released: 04/15/2002 Document Revised: 10/01/2015 Document Reviewed: 11/28/2013 Elsevier Interactive Patient Education  2018 [employer].  

## 2017-09-21 ENCOUNTER — Telehealth: Payer: Self-pay

## 2017-09-21 ENCOUNTER — Other Ambulatory Visit: Payer: Self-pay | Admitting: Neurology

## 2017-09-21 DIAGNOSIS — G4719 Other hypersomnia: Secondary | ICD-10-CM

## 2017-09-21 DIAGNOSIS — R0683 Snoring: Secondary | ICD-10-CM

## 2017-09-21 NOTE — Telephone Encounter (Signed)
[employer]BS denied in sleep study, need HST order

## 2017-09-21 NOTE — Telephone Encounter (Signed)
Order placed

## 2017-09-26 DIAGNOSIS — Z6834 Body mass index (BMI) 34.0-34.9, adult: Secondary | ICD-10-CM | POA: Diagnosis not present

## 2017-09-26 DIAGNOSIS — R002 Palpitations: Secondary | ICD-10-CM | POA: Diagnosis not present

## 2017-09-26 DIAGNOSIS — E039 Hypothyroidism, unspecified: Secondary | ICD-10-CM | POA: Diagnosis not present

## 2017-09-26 DIAGNOSIS — I10 Essential (primary) hypertension: Secondary | ICD-10-CM | POA: Diagnosis not present

## 2017-09-29 ENCOUNTER — Telehealth: Payer: Self-pay | Admitting: Internal Medicine

## 2017-09-29 NOTE — Telephone Encounter (Signed)
Received incoming records from Dr Rachell Cipro office for up coming appointment on 10/03/2017 @ 2:00pm with Dr Debara Pickett.  Records given to znichelle in medical Records 09/29/2017 [facility]

## 2017-10-03 ENCOUNTER — Encounter: Payer: Self-pay | Admitting: Internal Medicine

## 2017-10-03 ENCOUNTER — Ambulatory Visit (INDEPENDENT_AMBULATORY_CARE_PROVIDER_SITE_OTHER): Payer: BLUE CROSS/BLUE SHIELD | Admitting: Internal Medicine

## 2017-10-03 VITALS — BP 136/83 | HR 72 | Ht 63.0 in | Wt 191.0 lb

## 2017-10-03 DIAGNOSIS — I10 Essential (primary) hypertension: Secondary | ICD-10-CM

## 2017-10-03 DIAGNOSIS — R002 Palpitations: Secondary | ICD-10-CM | POA: Diagnosis not present

## 2017-10-03 NOTE — Progress Notes (Signed)
OFFICE NOTE  Chief Complaint:  Palpitations  Primary Care Physician: Fanny Bien, MD  HPI:  Susan Valencia is a 62 yo female with a history of palpitations, elevated BP, migraine headaches and soft flow murmur. After our first visit, I elected to start her on low dose propranolol XR 60 mg daily. She reported a marked improvement in her palpitations and almost a complete disappearance of her migraine headaches.  Since that time, she was getting some residual palpitations, only in the late evening. She then switched her medication to QHS dosing and noted an improvement. Today she is pretty much without complaints.  Susan Valencia returns today for follow-up. Overall she seems to be doing very well. She reports a propranolol works well for her palpitations and affect his done a good job of keeping her from having migraine headaches. She's had no side effects from Lipitor and is due for repeat testing. She denies any chest pain or worsening shortness of breath. Her only main concerns now are hot flashes and symptoms of being perimenopausal.  04/06/2016  Susan Valencia was seen today in follow-up. Overall she feels fairly well. She is still having some intermittent hot flashes. She says that her propranolol generally works for her palpitations although the other day she had palpitations mostly through one night. Her heart rate got up into the low 80s according to her fit but watch. She denies any fatigue with these episodes. Recently blood pressure was noted to be increased. Her primary care provider put her on low-dose losartan. This seems to be working for her. She is due for repeat testing of her lipid profile which was well controlled year ago. Weight is up about 11 pounds since her last office visit. She was recently found to be mildly hypothyroid and is on low-dose levothyroxine.  03/08/2017  Susan Valencia returns today for follow-up.  She reports some occasional palpitations that are breaking  through her propranolol.  She has well-controlled TSH and is on alternating levothyroxine 50/25 mcg every other day.  She also takes atorvastatin and losartan.  Blood pressures well controlled today at 130/82.  Thyroid and cholesterol followed by her primary care provider and were performed recently.  10/03/2017  Susan Valencia returns today for follow-up.  She again gets some palpitations, mostly in the evening and in the middle the night.  She recently saw her PCP who referred her for a sleep study however her insurance would not cover her study without a home study.  That is scheduled next week.  She denies any chest pain or worsening shortness of breath.  Blood pressure is reasonably well controlled.  Thyroid function is been normal.  PMHx:  Past Medical History:  Diagnosis Date  . ASCUS (atypical squamous cells of undetermined significance) on Pap smear 2009  . Daytime sleepiness   . Dyslipidemia   . Fibroid 2009  . Hydrosalpinx 2009  . Hyperlipidemia   . Hypertension   . Hypothyroidism   . Menorrhagia 2010  . Migraine    with menses  . Osteopenia 2006  . Osteoporosis 2006  . Palpitations   . Pelvic mass   . Snoring   . Systolic murmur 3151  . Tachycardia 2013    Past Surgical History:  Procedure Laterality Date  . CESAREAN SECTION    . hysteroscopic resection    . TRANSTHORACIC ECHOCARDIOGRAM  07/22/2011   EF >55%, normal    FAMHx:  Family History  Problem Relation Age of Onset  .  Cancer Mother        premenopausal breast   . Anuerysm Mother        abdominal and cerebral  . Cancer Sister        premenopausal breast  . Hypertension Father   . Heart disease Father 10  . Anuerysm Sister     SOCHx:   reports that she has never smoked. She has never used smokeless tobacco. She reports that she drinks alcohol. She reports that she does not use drugs.  ALLERGIES:  Allergies  Allergen Reactions  . Multihance [Gadobenate Dimeglumine] Nausea And Vomiting    Pt has  nausea and vomiting after injection of multihance for mri.    ROS: Pertinent items noted in HPI and remainder of comprehensive ROS otherwise negative.  HOME MEDS: Current Outpatient Medications  Medication Sig Dispense Refill  . levothyroxine (SYNTHROID, LEVOTHROID) 50 MCG tablet Take 50 mcg by mouth daily before breakfast. Alternation with 25 mcg    . alendronate (FOSAMAX) 70 MG tablet Take 70 mg by mouth every 7 (seven) days. Take with a full glass of water on an empty stomach.    Marland Kitchen atorvastatin (LIPITOR) 40 MG tablet TAKE 1 TABLET BY MOUTH EVERY DAY 30 tablet 11  . atovaquone-proguanil (MALARONE) 250-100 MG TABS tablet Take 1 tablet by mouth daily. Start 2 days prior to travel to malaria area, throughout travel and for 7 days upon return. 23 tablet 0  . Calcium Carbonate-Vitamin D (CALCIUM 500 + D PO) Take 2 tablets by mouth daily.    . ciprofloxacin (CIPRO) 500 MG tablet Take 1 tablet (500 mg total) by mouth 2 (two) times daily. Take 2-3 days until diarrhea resolves 8 tablet 0  . EVENING PRIMROSE OIL PO Take by mouth daily.    . IBUPROFEN PO Take by mouth as needed.    Marland Kitchen levothyroxine (SYNTHROID, LEVOTHROID) 25 MCG tablet Take 1-2 tablets by mouth daily.   0  . losartan (COZAAR) 50 MG tablet Take 1 tablet by mouth daily.  0  . MAGNESIUM PO Take by mouth daily.    . Multiple Vitamin (MULTIVITAMIN) tablet Take 1 tablet by mouth daily.    . propranolol ER (INDERAL LA) 60 MG 24 hr capsule TAKE ONE CAPSULE BY MOUTH EVERY DAY 30 capsule 11   No current facility-administered medications for this visit.     LABS/IMAGING: No results found for this or any previous visit (from the past 48 hour(s)). No results found.  VITALS: BP 136/83   Pulse 72   Ht 5\' 3"  (1.6 m)   Wt 191 lb (86.6 kg)   SpO2 98%   BMI 33.83 kg/m   EXAM: General appearance: alert and no distress Lungs: clear to auscultation bilaterally Heart: regular rate and rhythm, S1, S2 normal, no murmur, click, rub or  gallop Extremities: extremities normal, atraumatic, no cyanosis or edema Pulses: 2+ and symmetric  EKG: Normal sinus rhythm at 68-personally reviewed  ASSESSMENT: 1. Palpitations - mostly controlled on propranolol 2. Migraine headaches - improved with b-blocker 3. Dyslipidemia-stable on Lipitor 4. Essential hypertension 5. Hypothyroidism  PLAN: 1.   Mrs. Pendergraft continues to have some palpitations, they are mostly at night and occasionally she is awakened at night with them.  I agree with evaluation for possible sleep apnea.  She has a home sleep study coming up.  We will see if there is a possibly reversible condition, otherwise we could consider increasing her propranolol further.  Follow-up with me in 6 months or sooner  as necessary.  Pixie Casino, MD, [facility], [place] Director of the [facility]id [facility] &  Cardiovascular [facility] Diplomate of the American Board of Clinical Lipidology Attending Cardiologist  Direct Dial: [phone]  Fax: [phone]  Website:  [email or web address]  Nadean Corwin Hilty 10/03/2017, 2:21 PM

## 2017-10-03 NOTE — Patient Instructions (Signed)
Your physician wants you to follow-up in: 6 months with Dr. Hilty. You will receive a reminder letter in the mail two months in advance. If you don't receive a letter, please call our office to schedule the follow-up appointment.    

## 2017-10-11 ENCOUNTER — Ambulatory Visit (INDEPENDENT_AMBULATORY_CARE_PROVIDER_SITE_OTHER): Payer: BLUE CROSS/BLUE SHIELD | Admitting: Neurology

## 2017-10-11 DIAGNOSIS — G4719 Other hypersomnia: Secondary | ICD-10-CM

## 2017-10-11 DIAGNOSIS — G4733 Obstructive sleep apnea (adult) (pediatric): Secondary | ICD-10-CM

## 2017-10-11 DIAGNOSIS — G4710 Hypersomnia, unspecified: Secondary | ICD-10-CM | POA: Diagnosis not present

## 2017-10-11 DIAGNOSIS — R0683 Snoring: Secondary | ICD-10-CM

## 2017-10-12 NOTE — Addendum Note (Signed)
Addended by: Larey Seat on: 10/12/2017 05:31 PM   Modules accepted: Orders

## 2017-10-12 NOTE — Procedures (Signed)
NAME: Susan Valencia                                                                 DOB: 10-10-1955 MEDICAL RECORD NUMBER  [id number]                                             DOS:  10/11/2017 REFERRING PHYSICIAN: Rachell Cipro, M.D. STUDY PERFORMED: Home Sleep Study on watch pat HISTORY: [name] is a 62 y.o. female with African American and Cherokee ancestry (paternal grandmother), seen here for an evaluation of possible sleep apnea. Husband Dr. Janice Norrie, Urology, has witnessed her to snore but has not commented on apnea.  Chief complaint: [name] wakes up from her own snoring. Snoring seems to be present over the last 3-4 years, she had undergone a nasal septoplasty but snoring became worse! She has a dry mouth, but not dry eyes upon waking. She is unaware of any allergies.    Sometimes she has trouble to go to sleep and she has tried over-the-counter aids such as antihistamines, ZzzQuil, but she also suffers from a dry mouth which can get worse from it. She sleeps or her side, on many pillows, difficult to find comfort. She wakes up from vivid dreams- every night and all night. Not nightmares- but lucid. She wakes some nights with palpitations, hot flushes are present, too. According to her fit-bit she averages 5.5 hours.    Epworth score 8 points, Fatigue severity score 15 points, BMI: 34.0  STUDY RESULTS:  Total Recording Time: 8 hours 42 minutes, Valid test time 7h and 38 min. Total Apnea/Hypopnea Index (AHI): 16.4 /h   RDI:  17.6 /h Average Oxygen Saturation:  95 % Lowest Oxygen Desaturation: 85 %  Total Time Oxygen Saturation Below or at 88 %: 0.4 minutes  Average Heart Rate:    65 bpm (between 51 and 112 bpm) IMPRESSION: Mild - Moderate Obstructive Sleep Apnea without clinically significant hypoxemia.  Normal variability of heart rate, no arrhythmia.   RECOMMENDATION: I recommend CPAP treatment, by PAP auto-titration, 5-12 cm water, 2 cm EPR, heated humidity and nasal mask or pillow.   Should the patient report claustrophobia or difficulties with nasal airway patency, a dental device can be considered.   I certify that I have reviewed the raw data recording prior to the issuance of this report in accordance with the standards of the American Academy of Sleep Medicine (AASM). Larey Seat, M.D.    10-12-2017    Medical Director of [facility] at [facility], accredited by the AASM. Diplomat of the ABPN and ABSM.

## 2017-10-17 ENCOUNTER — Telehealth: Payer: Self-pay | Admitting: Neurology

## 2017-10-17 NOTE — Telephone Encounter (Signed)
I called pt. I advised pt that Dr. Brett Fairy reviewed their sleep study results and found that pt has sleep apnea. Dr. Brett Fairy recommends that pt start a auto CPAP. I reviewed PAP compliance expectations with the pt. Pt is agreeable to starting a CPAP. I advised pt that an order will be sent to a DME, [facility], and [facility] will call the pt within about one week after they file with the pt's insurance. [facility] will show the pt how to use the machine, fit for masks, and troubleshoot the CPAP if needed. A follow up appt was made for insurance purposes with Dr. Brett Fairy on Sept 17, 2019 at 1:30 pm. Pt verbalized understanding to arrive 15 minutes early and bring their CPAP. A letter with all of this information in it will be mailed to the pt as a reminder. I verified with the pt that the address we have on file is correct. Pt verbalized understanding of results. Pt had no questions at this time but was encouraged to call back if questions arise.

## 2017-10-17 NOTE — Telephone Encounter (Signed)
-----   Message from Larey Seat, MD sent at 10/12/2017  5:31 PM EDT ----- Cc Dr Ernie Hew.   Total Apnea/Hypopnea Index (AHI): 16.4 /h   RDI:  17.6 /h IMPRESSION: Mild - Moderate Obstructive Sleep Apnea without clinically significant hypoxemia. Normal variability of heart rate, no arrhythmia.  RECOMMENDATION: I recommend CPAP treatment, by PAP auto-titration, 5-12 cm water, 2 cm EPR, heated humidity and  nasal mask or pillow. Should the patient report claustrophobia or difficulties with nasal airway patency, a dental device can be considered.

## 2017-10-26 DIAGNOSIS — Z1329 Encounter for screening for other suspected endocrine disorder: Secondary | ICD-10-CM | POA: Diagnosis not present

## 2017-10-26 DIAGNOSIS — G4733 Obstructive sleep apnea (adult) (pediatric): Secondary | ICD-10-CM | POA: Diagnosis not present

## 2017-10-26 DIAGNOSIS — Z0000 Encounter for general adult medical examination without abnormal findings: Secondary | ICD-10-CM | POA: Diagnosis not present

## 2017-10-26 DIAGNOSIS — E559 Vitamin D deficiency, unspecified: Secondary | ICD-10-CM | POA: Diagnosis not present

## 2017-10-26 DIAGNOSIS — Z114 Encounter for screening for human immunodeficiency virus [HIV]: Secondary | ICD-10-CM | POA: Diagnosis not present

## 2017-10-30 DIAGNOSIS — Z13220 Encounter for screening for lipoid disorders: Secondary | ICD-10-CM | POA: Diagnosis not present

## 2017-11-07 DIAGNOSIS — Z6834 Body mass index (BMI) 34.0-34.9, adult: Secondary | ICD-10-CM | POA: Diagnosis not present

## 2017-11-07 DIAGNOSIS — Z1211 Encounter for screening for malignant neoplasm of colon: Secondary | ICD-10-CM | POA: Diagnosis not present

## 2017-11-07 DIAGNOSIS — Z0000 Encounter for general adult medical examination without abnormal findings: Secondary | ICD-10-CM | POA: Diagnosis not present

## 2017-11-07 DIAGNOSIS — E039 Hypothyroidism, unspecified: Secondary | ICD-10-CM | POA: Diagnosis not present

## 2017-11-07 DIAGNOSIS — Z23 Encounter for immunization: Secondary | ICD-10-CM | POA: Diagnosis not present

## 2017-11-13 DIAGNOSIS — R3915 Urgency of urination: Secondary | ICD-10-CM | POA: Diagnosis not present

## 2017-11-13 DIAGNOSIS — Z01411 Encounter for gynecological examination (general) (routine) with abnormal findings: Secondary | ICD-10-CM | POA: Diagnosis not present

## 2017-11-13 DIAGNOSIS — G4730 Sleep apnea, unspecified: Secondary | ICD-10-CM | POA: Diagnosis not present

## 2017-11-13 DIAGNOSIS — R002 Palpitations: Secondary | ICD-10-CM | POA: Diagnosis not present

## 2017-11-13 DIAGNOSIS — E039 Hypothyroidism, unspecified: Secondary | ICD-10-CM | POA: Diagnosis not present

## 2017-11-25 DIAGNOSIS — G4733 Obstructive sleep apnea (adult) (pediatric): Secondary | ICD-10-CM | POA: Diagnosis not present

## 2017-12-18 DIAGNOSIS — R946 Abnormal results of thyroid function studies: Secondary | ICD-10-CM | POA: Diagnosis not present

## 2017-12-18 DIAGNOSIS — E039 Hypothyroidism, unspecified: Secondary | ICD-10-CM | POA: Diagnosis not present

## 2017-12-18 DIAGNOSIS — R739 Hyperglycemia, unspecified: Secondary | ICD-10-CM | POA: Diagnosis not present

## 2017-12-18 DIAGNOSIS — E782 Mixed hyperlipidemia: Secondary | ICD-10-CM | POA: Diagnosis not present

## 2017-12-25 DIAGNOSIS — R3915 Urgency of urination: Secondary | ICD-10-CM | POA: Diagnosis not present

## 2017-12-25 DIAGNOSIS — R002 Palpitations: Secondary | ICD-10-CM | POA: Diagnosis not present

## 2017-12-25 DIAGNOSIS — E039 Hypothyroidism, unspecified: Secondary | ICD-10-CM | POA: Diagnosis not present

## 2017-12-25 DIAGNOSIS — N951 Menopausal and female climacteric states: Secondary | ICD-10-CM | POA: Diagnosis not present

## 2017-12-26 DIAGNOSIS — R739 Hyperglycemia, unspecified: Secondary | ICD-10-CM | POA: Diagnosis not present

## 2017-12-26 DIAGNOSIS — G4733 Obstructive sleep apnea (adult) (pediatric): Secondary | ICD-10-CM | POA: Diagnosis not present

## 2017-12-26 DIAGNOSIS — E669 Obesity, unspecified: Secondary | ICD-10-CM | POA: Diagnosis not present

## 2017-12-26 DIAGNOSIS — Z23 Encounter for immunization: Secondary | ICD-10-CM | POA: Diagnosis not present

## 2017-12-26 DIAGNOSIS — E039 Hypothyroidism, unspecified: Secondary | ICD-10-CM | POA: Diagnosis not present

## 2017-12-26 DIAGNOSIS — Z6833 Body mass index (BMI) 33.0-33.9, adult: Secondary | ICD-10-CM | POA: Diagnosis not present

## 2017-12-29 ENCOUNTER — Ambulatory Visit: Payer: BLUE CROSS/BLUE SHIELD | Admitting: Internal Medicine

## 2018-01-09 DIAGNOSIS — Z23 Encounter for immunization: Secondary | ICD-10-CM | POA: Diagnosis not present

## 2018-01-17 ENCOUNTER — Encounter: Payer: Self-pay | Admitting: Neurology

## 2018-01-23 ENCOUNTER — Other Ambulatory Visit: Payer: Self-pay | Admitting: Neurology

## 2018-01-23 ENCOUNTER — Encounter: Payer: Self-pay | Admitting: Neurology

## 2018-01-23 ENCOUNTER — Ambulatory Visit (INDEPENDENT_AMBULATORY_CARE_PROVIDER_SITE_OTHER): Payer: BLUE CROSS/BLUE SHIELD | Admitting: Neurology

## 2018-01-23 VITALS — BP 130/75 | HR 61 | Ht 62.5 in | Wt 189.0 lb

## 2018-01-23 DIAGNOSIS — E039 Hypothyroidism, unspecified: Secondary | ICD-10-CM

## 2018-01-23 DIAGNOSIS — G4733 Obstructive sleep apnea (adult) (pediatric): Secondary | ICD-10-CM

## 2018-01-23 DIAGNOSIS — G4719 Other hypersomnia: Secondary | ICD-10-CM

## 2018-01-23 DIAGNOSIS — R5383 Other fatigue: Secondary | ICD-10-CM | POA: Diagnosis not present

## 2018-01-23 DIAGNOSIS — R0683 Snoring: Secondary | ICD-10-CM | POA: Diagnosis not present

## 2018-01-23 NOTE — Patient Instructions (Signed)

## 2018-01-23 NOTE — Progress Notes (Signed)
SLEEP MEDICINE CLINIC   Provider:  Larey Seat, M D  Primary Care Physician:  Fanny Bien, MD   Referring Provider: Fanny Bien, MD    Chief Complaint  Patient presents with  . Follow-up    pt alone, rm 11 pt states that she wakes up every night usually around that 4 hour mark and takes it off and can sometimes have a hard time going back to sleep. DME [facility]    HPI:  Susan Valencia is a 62 y.o. female with African American and Cherokee ancestry( paternal grandmother was full blood). , seen here as in a referral from Dr. Ernie Hew for an evaluation of possible sleep apnea. Husband Dr Janice Norrie has witnessed her to snore but has not commented on apnea.  Chief complaint according to patient : she wakes up from her own snoring. Snoring seems to be prresent over the last 3-4 years, she had a septo-naso plasty but that made it worse ! She has a dry mouth, but not dry eyes. She is unaware of any allergies.   Sleep habits are as follows: Susan Valencia reports that she usually reads before she goes to bed, sometimes she will watch TV but not in the bedroom. She often goes late, between 1-2 AM.  Rarely does she read on the iPad she is usually a liter in an old fashion with pages.  Sometimes she has trouble to go to sleep and she has tried over-the-counter aids such as antihistamines, ZzzQuil, but she also suffers from a dry mouth which can get worse from it. She sleeps or her side, on many pillows, difficult to find comfort  She wakes up from vivid dreams- every night and all night. Not nightmares but lucid.  She often doesn't remember her dreams. She wakes some nights with palpitations, which she has attributed to thyroid disease. Hot flushes are present, too.  Rises at 8.30 AM, husband rises at 5 AM ( Uro-surgeon). Often she sleeps in a different room. She looked at her fit-bit and averages 5.5 hours.   Sleep medical history and family sleep history: thyroid disease, related weight gain,  cold intolerance, hot flushes with menopause. 2 sisters have hypothyroidism. No goiter. One sister has insomnia. She had 4 sisters and 2 brothers.  Weight gain with menopause 4-5 years. Slowly progressive .   Social history: married,  2 adult daughters,  No grandchildren. She worked for [employer]. Non smoker, parents were smokers.  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    Interval history from 23 January 2018, I have the pleasure of meeting Susan Valencia Today who underwent a home sleep test on a watch [place] on 11 October 2017 study indicated mild to moderate sleep apnea with an AHI of 16.4, and oxygen nadir at 85% SPO2, total time in oxygen saturation less than 1 minute.  There was however a wide variability of her heart rate between 51 and 112 bpm.  I recommended CPAP treatment by auto titration device between 5 and 12 cmH2O pressure was 2 cm expiratory pressure relief.  The patient has been followed by [facility] as a durable medical equipment company and I am here to review her download of data from the machine she has been 87% compliant 4 days of use 26 out of 30, 24 days for over 4 hours of consecutive use.  Average use of time is  only 3 hours 53 minutes, the AutoSet is set to the above specified pressure window the residual AHI is 1.1 only the 95th percentile pressure is 9.2 cm water and she does not have major air leaks.  Also the machine numerically seems to work well for her the patient has not enjoyed being on CPAP and has not felt a major improvement she actually feels that the CPAP keeps her awake.  This may be why her average use of time is just straddling the 4-hour mark.  Since her baseline study was a home sleep test I cannot truly estimate how much of her apnea may be REM accentuated but given that she did not have oxygen desaturations she could be a candidate for a dental device for mandibular  advancement.  She would rather pursue weight loss as a main treatment. She has a dental guard device to prevent bruxism, would not be opposed to use mandibular advancement.  DDS Mirna Mires, [place].    Review of Systems: Out of a complete 14 system review, the patient complains of only the following symptoms, and all other reviewed systems are negative.  Epworth score  endorsed at 3 points down from 8/ 24 points before CPAP , Fatigue severity score now 9 from 15  , depression score n/a .  The patient attribute the improved fatigue to increase in thyroid medication.   Social History   Socioeconomic History  . Marital status: Married    Spouse name: Not on file  . Number of children: Not on file  . Years of education: Not on file  . Highest education level: Not on file  Occupational History  . Not on file  Social Needs  . Financial resource strain: Not on file  . Food insecurity:    Worry: Not on file    Inability: Not on file  . Transportation needs:    Medical: Not on file    Non-medical: Not on file  Tobacco Use  . Smoking status: Never Smoker  . Smokeless tobacco: Never Used  Substance and Sexual Activity  . Alcohol use: Yes    Comment: occasional/ maybe 4 glasses of wine a year  . Drug use: No  . Sexual activity: Yes    Birth control/protection: Post-menopausal  Lifestyle  . Physical activity:    Days per week: Not on file    Minutes per session: Not on file  . Stress: Not on file  Relationships  . Social connections:    Talks on phone: Not on file    Gets together: Not on file    Attends religious service: Not on file    Active member of club or organization: Not on file    Attends meetings of clubs or organizations: Not on file    Relationship status: Not on file  . Intimate partner violence:    Fear of current or ex partner: Not on file    Emotionally abused: Not on file    Physically abused: Not on file    Forced sexual activity: Not on file    Other Topics Concern  . Not on file  Social History Narrative  . Not on file    Family History  Problem Relation Age of Onset  . Cancer Mother        premenopausal breast   . Anuerysm Mother        abdominal and cerebral  . Cancer Sister        premenopausal breast  . Hypertension  Father   . Heart disease Father 5  . Anuerysm Sister     Past Medical History:  Diagnosis Date  . ASCUS (atypical squamous cells of undetermined significance) on Pap smear 2009  . Daytime sleepiness   . Dyslipidemia   . Fibroid 2009  . Hydrosalpinx 2009  . Hyperlipidemia   . Hypertension   . Hypothyroidism   . Menorrhagia 2010  . Migraine    with menses  . Osteopenia 2006  . Osteoporosis 2006  . Palpitations   . Pelvic mass   . Snoring   . Systolic murmur 2440  . Tachycardia 2013    Past Surgical History:  Procedure Laterality Date  . CESAREAN SECTION    . hysteroscopic resection    . TRANSTHORACIC ECHOCARDIOGRAM  07/22/2011   EF >55%, normal    Current Outpatient Medications  Medication Sig Dispense Refill  . alendronate (FOSAMAX) 70 MG tablet Take 70 mg by mouth every 7 (seven) days. Take with a full glass of water on an empty stomach.    Marland Kitchen atorvastatin (LIPITOR) 40 MG tablet TAKE 1 TABLET BY MOUTH EVERY DAY 30 tablet 11  . atovaquone-proguanil (MALARONE) 250-100 MG TABS tablet Take 1 tablet by mouth daily. Start 2 days prior to travel to malaria area, throughout travel and for 7 days upon return. 23 tablet 0  . Calcium Carbonate-Vitamin D (CALCIUM 500 + D PO) Take 2 tablets by mouth daily.    . ciprofloxacin (CIPRO) 500 MG tablet Take 1 tablet (500 mg total) by mouth 2 (two) times daily. Take 2-3 days until diarrhea resolves 8 tablet 0  . EVENING PRIMROSE OIL PO Take by mouth daily.    . IBUPROFEN PO Take by mouth as needed.    Marland Kitchen levothyroxine (SYNTHROID, LEVOTHROID) 50 MCG tablet Take 50 mcg by mouth daily before breakfast. Alternation with 25 mcg    . losartan (COZAAR) 50 MG  tablet Take 1 tablet by mouth daily.  0  . MAGNESIUM PO Take by mouth daily.    . Multiple Vitamin (MULTIVITAMIN) tablet Take 1 tablet by mouth daily.    . propranolol ER (INDERAL LA) 60 MG 24 hr capsule TAKE ONE CAPSULE BY MOUTH EVERY DAY 30 capsule 11   No current facility-administered medications for this visit.     Allergies as of 01/23/2018 - Review Complete 01/23/2018  Allergen Reaction Noted  . Multihance [gadobenate dimeglumine] Nausea And Vomiting 06/28/2010    Vitals: BP 130/75   Pulse 61   Ht 5' 2.5" (1.588 m)   Wt 189 lb (85.7 kg)   BMI 34.02 kg/m  Last Weight:  Wt Readings from Last 1 Encounters:  01/23/18 189 lb (85.7 kg)   NUU:VOZD mass index is 34.02 kg/m.     Last Height:   Ht Readings from Last 1 Encounters:  01/23/18 5' 2.5" (1.588 m)    Physical exam:  General: The patient is awake, alert and appears not in acute distress. The patient is well groomed. Head: Normocephalic, atraumatic. Neck is supple. Mallampati 5 - uvula not visible.   neck circumference:16 inches . Nasal airflow while siting is patent  Nasal septum is straight., Retrognathia is not seen.  Cardiovascular:  Regular rate and rhythm Respiratory: Lungs are clear to auscultation. Skin:  Without evidence of edema, or rash Trunk: BMI is elevated at 34.1.   Neurologic exam : The patient is awake and alert, oriented to place and time.    Cranial nerves: Pupils are equal and briskly reactive to  light.   Extraocular movements  in vertical and horizontal planes intact and without nystagmus. Visual fields by finger perimetry are intact. Hearing to finger rub intact. Facial motor strength is symmetric and tongue and uvula move midline. Shoulder shrug was symmetrical.   Motor exam:  Normal tone, muscle bulk and symmetric strength -She has very tight shoulders, and neck muscles.  Sensory:  Fine touch, pinprick and vibration were normal in hands and feet.   Proprioception tested in the upper  extremities was normal. Coordination: Rapid alternating movements in the fingers/hands was normal. Finger-to-nose maneuver  normal without evidence of ataxia, dysmetria or tremor.  Gait and station: Patient walks without assistive device and is able unassisted to climb up to the exam table. Strength within normal limits.  Stance is stable and normal. Deep tendon reflexes: in the  upper and lower extremities are symmetric and intact. .    Assessment:  After physical and neurologic examination, review of laboratory studies,  Personal review of imaging studies, reports of other /same  Imaging studies, results of polysomnography and / or neurophysiology testing and pre-existing records as far as provided in visit., my assessment is   1) OSA- mild to moderate -no hypoxemia, HST diagnosed.    I spent more than 15 minutes of face to face time with the patient.  Greater than 50% of time was spent in counseling and coordination of care. We have discussed the diagnosis and differential and I answered the patient's questions.    Plan:  Treatment plan and additional workup : mild to moderate OSA did respond well to CPAP autotitration, but Susan Valencia doesn't sleep better, she feels she sleeps less.  She is not a fan of CPAP and would like to have a dental evaluation and a more concerted weight loss program to address the main risk factor. .   Referral to Dr. Evelene Croon.    Larey Seat, MD 12/01/3662, 4:03 PM  Certified in Neurology by ABPN Certified in [place] by [facility] [address] [place], [place] [address]

## 2018-01-23 NOTE — Addendum Note (Signed)
Addended by: Larey Seat on: 01/23/2018 02:10 PM   Modules accepted: Orders

## 2018-01-24 ENCOUNTER — Ambulatory Visit: Payer: BLUE CROSS/BLUE SHIELD | Admitting: Endocrinology

## 2018-01-24 DIAGNOSIS — Z634 Disappearance and death of family member: Secondary | ICD-10-CM | POA: Diagnosis not present

## 2018-01-24 DIAGNOSIS — E039 Hypothyroidism, unspecified: Secondary | ICD-10-CM | POA: Diagnosis not present

## 2018-01-24 DIAGNOSIS — R3915 Urgency of urination: Secondary | ICD-10-CM | POA: Diagnosis not present

## 2018-01-24 DIAGNOSIS — R002 Palpitations: Secondary | ICD-10-CM | POA: Diagnosis not present

## 2018-04-02 DIAGNOSIS — E039 Hypothyroidism, unspecified: Secondary | ICD-10-CM | POA: Diagnosis not present

## 2018-04-09 DIAGNOSIS — I10 Essential (primary) hypertension: Secondary | ICD-10-CM | POA: Diagnosis not present

## 2018-04-09 DIAGNOSIS — G479 Sleep disorder, unspecified: Secondary | ICD-10-CM | POA: Diagnosis not present

## 2018-04-09 DIAGNOSIS — E039 Hypothyroidism, unspecified: Secondary | ICD-10-CM | POA: Diagnosis not present

## 2018-05-14 ENCOUNTER — Other Ambulatory Visit: Payer: Self-pay | Admitting: Internal Medicine

## 2018-11-15 DIAGNOSIS — Z0000 Encounter for general adult medical examination without abnormal findings: Secondary | ICD-10-CM | POA: Diagnosis not present

## 2018-11-21 DIAGNOSIS — E782 Mixed hyperlipidemia: Secondary | ICD-10-CM | POA: Diagnosis not present

## 2018-11-21 DIAGNOSIS — E039 Hypothyroidism, unspecified: Secondary | ICD-10-CM | POA: Diagnosis not present

## 2018-11-21 DIAGNOSIS — I10 Essential (primary) hypertension: Secondary | ICD-10-CM | POA: Diagnosis not present

## 2018-11-21 DIAGNOSIS — G4700 Insomnia, unspecified: Secondary | ICD-10-CM | POA: Diagnosis not present

## 2018-11-26 DIAGNOSIS — Z1231 Encounter for screening mammogram for malignant neoplasm of breast: Secondary | ICD-10-CM | POA: Diagnosis not present

## 2018-12-05 ENCOUNTER — Other Ambulatory Visit: Payer: Self-pay

## 2019-03-01 ENCOUNTER — Other Ambulatory Visit: Payer: Self-pay

## 2019-03-04 ENCOUNTER — Encounter: Payer: Self-pay | Admitting: Endocrinology

## 2019-03-04 ENCOUNTER — Ambulatory Visit (INDEPENDENT_AMBULATORY_CARE_PROVIDER_SITE_OTHER): Payer: PPO | Admitting: Endocrinology

## 2019-03-04 ENCOUNTER — Other Ambulatory Visit: Payer: Self-pay

## 2019-03-04 VITALS — BP 118/84 | HR 89 | Ht 62.5 in | Wt 186.8 lb

## 2019-03-04 DIAGNOSIS — E063 Autoimmune thyroiditis: Secondary | ICD-10-CM

## 2019-03-04 DIAGNOSIS — M810 Age-related osteoporosis without current pathological fracture: Secondary | ICD-10-CM

## 2019-03-04 DIAGNOSIS — E041 Nontoxic single thyroid nodule: Secondary | ICD-10-CM | POA: Diagnosis not present

## 2019-03-04 LAB — T4, FREE: Free T4: 1.03 ng/dL (ref 0.60–1.60)

## 2019-03-04 LAB — TSH: TSH: 2.84 u[IU]/mL (ref 0.35–4.50)

## 2019-03-04 NOTE — Progress Notes (Addendum)
Patient ID: Susan Valencia, female   DOB: 01/17/1956, 63 y.o.   MRN: [id number]            Referring Provider: Kendall Flack  Reason for Appointment:  Hypothyroidism, new visit    History of Present Illness:   Hypothyroidism was first diagnosed in 2017  At the time of diagnosis patient was not having any symptoms of  fatigue, cold sensitivity, difficulty concentrating, dry skin or hair loss .      She was having some difficulty losing weight as before She thinks her labs were tested because of family history of thyroid disease as well as routine annual visits       The patient has been treated with  either 25 or 50 mcg of levothyroxine since 2017 She thinks that her initial dose may have been excessive and she was told to cut back However no records treatment from PCP are available  With starting thyroid supplementation she did not feel any different and also had not felt any different with adjusting her dosage up or down Her weight has been about the same and stable in the last year  The patient takes the thyroid supplement before breakfast daily         Patient's weight history is as follows:  Wt Readings from Last 3 Encounters:  03/04/19 186 lb 12.8 oz (84.7 kg)  01/23/18 189 lb (85.7 kg)  10/03/17 191 lb (86.6 kg)    Thyroid function results have been as follows:  Most recent TSH in 11/2018 was 3.1  Lab Results  Component Value Date   TSH 2.375 04/01/2015     Past Medical History:  Diagnosis Date  . ASCUS (atypical squamous cells of undetermined significance) on Pap smear 2009  . Daytime sleepiness   . Dyslipidemia   . Fibroid 2009  . Hydrosalpinx 2009  . Hyperlipidemia   . Hypertension   . Hypothyroidism   . Menorrhagia 2010  . Migraine    with menses  . Osteopenia 2006  . Osteoporosis 2006  . Palpitations   . Pelvic mass   . Snoring   . Systolic murmur 0000000  . Tachycardia 2013    Past Surgical History:  Procedure Laterality Date  . CESAREAN  SECTION    . hysteroscopic resection    . TRANSTHORACIC ECHOCARDIOGRAM  07/22/2011   EF >55%, normal    Family History  Problem Relation Age of Onset  . Cancer Mother        premenopausal breast   . Anuerysm Mother        abdominal and cerebral  . Cancer Sister        premenopausal breast  . Hypertension Father   . Heart disease Father 56  . Anuerysm Sister     Social History:  reports that she has never smoked. She has never used smokeless tobacco. She reports current alcohol use. She reports that she does not use drugs.  Allergies:  Allergies  Allergen Reactions  . Multihance [Gadobenate Dimeglumine] Nausea And Vomiting    Pt has nausea and vomiting after injection of multihance for mri.    Allergies as of 03/04/2019      Reactions   Multihance [gadobenate Dimeglumine] Nausea And Vomiting   Pt has nausea and vomiting after injection of multihance for mri.      Medication List       Accurate as of March 04, 2019 11:19 AM. If you have any questions, ask your nurse or doctor.  STOP taking these medications   ciprofloxacin 500 MG tablet Commonly known as: Cipro Stopped by: Elayne Snare, MD     TAKE these medications   alendronate 70 MG tablet Commonly known as: FOSAMAX Take 70 mg by mouth every 7 (seven) days. Take with a full glass of water on an empty stomach.   atorvastatin 40 MG tablet Commonly known as: LIPITOR TAKE 1 TABLET BY MOUTH EVERY DAY   atovaquone-proguanil 250-100 MG Tabs tablet Commonly known as: MALARONE Take 1 tablet by mouth daily. Start 2 days prior to travel to malaria area, throughout travel and for 7 days upon return.   CALCIUM 500 + D PO Take 2 tablets by mouth daily.   EVENING PRIMROSE OIL PO Take by mouth daily.   IBUPROFEN PO Take by mouth as needed.   levothyroxine 50 MCG tablet Commonly known as: SYNTHROID Take 50 mcg by mouth daily before breakfast.   losartan 50 MG tablet Commonly known as: COZAAR Take 1  tablet by mouth daily.   MAGNESIUM PO Take by mouth daily.   multivitamin tablet Take 1 tablet by mouth daily.   propranolol ER 60 MG 24 hr capsule Commonly known as: INDERAL LA TAKE ONE CAPSULE BY MOUTH EVERY DAY          Review of Systems  Constitutional: Negative for weight loss and weight gain.  HENT: Negative for trouble swallowing.   Eyes: Negative for visual disturbance.  Respiratory: Negative for daytime sleepiness and shortness of breath.   Cardiovascular: Positive for palpitations.       Longstanding palpitations with feeling of rapid heartbeat.  She feels her palpitations mostly at night when trying to sleep and more on lying on the left side.  Not seen by cardiologist since last year.  Only diagnosis made was tachycardia.  Palpitations may be more regular recently every night  Gastrointestinal: Negative for constipation.  Endocrine: Positive for heat intolerance. Negative for fatigue and cold intolerance.  Musculoskeletal: Negative for joint pain.  Skin: Negative for dry skin.  Neurological: Negative for weakness.  Psychiatric/Behavioral: Positive for insomnia.            Examination:    BP 118/84 (BP Location: Left Arm, Patient Position: Sitting, Cuff Size: Normal)   Pulse 89   Ht 5' 2.5" (1.588 m)   Wt 186 lb 12.8 oz (84.7 kg)   SpO2 98%   BMI 33.62 kg/m   GENERAL:  Large build, generalized obesity present  No pallor.    Skin:  no rash or evident skin lesions.  EYES:  No prominence of the eyes or swelling of the eyelids  ENT: Oral exam deferred  NECK: No lymphadenopathy  THYROID: Indistinct 1.5 cm nodule felt on the left lower lobe on swallowing Rest of the thyroid is not palpable  HEART:  Normal  S1 and S2; no murmur or click.  CHEST:    Lungs: Vescicular breath sounds heard equally.  No crepitations/ wheeze.  ABDOMEN:  No distention.  Liver and spleen not palpable.  No other mass or tenderness.  NEUROLOGICAL: Reflexes are bilaterally  slightly brisk at biceps.  No tremor.  EXTREMITIES:  Normal peripheral joints.  No ankle edema present   Assessment:  HYPOTHYROIDISM, appears to be subclinical  No details of her initial diagnosis or work-up are available However she has not had any change in how she feels with taking thyroid supplements and has not had any progression of her mild hypothyroidism with taking only 50 mcg of levothyroxine  She has a history of palpitations despite taking propranolol and unclear if this is made any worse with thyroid supplementation  She likely has autoimmune thyroid disease since she has a strong family history of presumably hypothyroidism No goiter felt although indistinct left thyroid nodule felt on exam today  History of OSTEOPOROSIS on long-term therapy with Fosamax: Details not available This has been managed by the gynecologist Discussed that usually continuous treatment with alendronate should not be continued beyond 10 years and she will discuss this further with her gynecologist  PLAN:   Check thyroid levels again today since it has been 3 months since her last labs If her TSH is still normal would consider stopping levothyroxine to see if she has any significant change in her symptoms as well as reestablish a baseline TSH Records from PCP will be requested  Patient given information brochure on hyperthyroidism and Hashimoto's thyroiditis  Thyroid ultrasound will be ordered Discussed that if she has a significant nodule based on ultrasound characteristics may consider needle aspiration biopsy which was described today.   Follow-up tentatively in 2 months   Elayne Snare 03/04/2019, 11:19 AM   Consultation note copy sent to the referring physician  Note: This office note was prepared with Dragon voice recognition system technology. Any transcriptional errors that result from this process are unintentional.  Addendum: TSH is 2.8, to stop levothyroxine and follow-up in 2  months   Dwyane Dee

## 2019-03-11 ENCOUNTER — Ambulatory Visit
Admission: RE | Admit: 2019-03-11 | Discharge: 2019-03-11 | Disposition: A | Discharge status: Home / Self Care | Payer: PPO | Source: Ambulatory Visit | Attending: Endocrinology | Admitting: Endocrinology

## 2019-03-11 DIAGNOSIS — E041 Nontoxic single thyroid nodule: Secondary | ICD-10-CM

## 2019-04-10 DIAGNOSIS — M8580 Other specified disorders of bone density and structure, unspecified site: Secondary | ICD-10-CM | POA: Diagnosis not present

## 2019-04-10 DIAGNOSIS — Z124 Encounter for screening for malignant neoplasm of cervix: Secondary | ICD-10-CM | POA: Diagnosis not present

## 2019-04-10 DIAGNOSIS — Z01419 Encounter for gynecological examination (general) (routine) without abnormal findings: Secondary | ICD-10-CM | POA: Diagnosis not present

## 2019-04-10 DIAGNOSIS — Z1239 Encounter for other screening for malignant neoplasm of breast: Secondary | ICD-10-CM | POA: Diagnosis not present

## 2019-04-10 DIAGNOSIS — Z1211 Encounter for screening for malignant neoplasm of colon: Secondary | ICD-10-CM | POA: Diagnosis not present

## 2019-04-10 DIAGNOSIS — Z6833 Body mass index (BMI) 33.0-33.9, adult: Secondary | ICD-10-CM | POA: Diagnosis not present

## 2019-04-11 ENCOUNTER — Telehealth: Payer: Self-pay | Admitting: Endocrinology

## 2019-04-11 NOTE — Telephone Encounter (Signed)
Recv'd medical records from Dr. Murvin Natal office forwarded 44 pages to Dr. Elayne Snare 12/3/20fbg

## 2019-04-22 DIAGNOSIS — M25561 Pain in right knee: Secondary | ICD-10-CM | POA: Diagnosis not present

## 2019-04-22 DIAGNOSIS — M25562 Pain in left knee: Secondary | ICD-10-CM | POA: Diagnosis not present

## 2019-04-28 ENCOUNTER — Other Ambulatory Visit: Payer: Self-pay | Admitting: Endocrinology

## 2019-04-28 DIAGNOSIS — E041 Nontoxic single thyroid nodule: Secondary | ICD-10-CM

## 2019-04-29 ENCOUNTER — Other Ambulatory Visit: Payer: Self-pay

## 2019-04-29 ENCOUNTER — Other Ambulatory Visit (INDEPENDENT_AMBULATORY_CARE_PROVIDER_SITE_OTHER): Payer: PPO

## 2019-04-29 DIAGNOSIS — E041 Nontoxic single thyroid nodule: Secondary | ICD-10-CM

## 2019-04-29 LAB — TSH: TSH: 10.3 u[IU]/mL — ABNORMAL HIGH (ref 0.35–4.50)

## 2019-04-29 LAB — T4, FREE: Free T4: 0.87 ng/dL (ref 0.60–1.60)

## 2019-04-30 DIAGNOSIS — M94261 Chondromalacia, right knee: Secondary | ICD-10-CM | POA: Diagnosis not present

## 2019-04-30 DIAGNOSIS — M25662 Stiffness of left knee, not elsewhere classified: Secondary | ICD-10-CM | POA: Diagnosis not present

## 2019-04-30 DIAGNOSIS — M6281 Muscle weakness (generalized): Secondary | ICD-10-CM | POA: Diagnosis not present

## 2019-04-30 DIAGNOSIS — M94262 Chondromalacia, left knee: Secondary | ICD-10-CM | POA: Diagnosis not present

## 2019-05-06 ENCOUNTER — Other Ambulatory Visit: Payer: Self-pay

## 2019-05-07 ENCOUNTER — Ambulatory Visit (INDEPENDENT_AMBULATORY_CARE_PROVIDER_SITE_OTHER): Payer: PPO | Admitting: Endocrinology

## 2019-05-07 ENCOUNTER — Encounter: Payer: Self-pay | Admitting: Endocrinology

## 2019-05-07 VITALS — BP 130/82 | HR 68 | Ht 62.5 in | Wt 186.6 lb

## 2019-05-07 DIAGNOSIS — E063 Autoimmune thyroiditis: Secondary | ICD-10-CM

## 2019-05-07 NOTE — Progress Notes (Addendum)
Patient ID: Susan Valencia, female   DOB: 05-Feb-1956, 63 y.o.   MRN: [id number]            Referring Provider: Kendall Flack  Reason for Appointment:  Hypothyroidism, new visit    History of Present Illness:   Hypothyroidism was first diagnosed in 2017  At the time of diagnosis patient was not having any symptoms of  fatigue, cold sensitivity, difficulty concentrating, dry skin or hair loss .      She was having some difficulty losing weight as before She thinks her labs were tested because of family history of thyroid disease as well as routine annual visits       The patient has been treated with  either 25 or 50 mcg of levothyroxine since 2017 She thinks that her initial dose may have been excessive and she was told to cut back With starting thyroid supplementation she did not feel any different and also had not felt any different with adjusting her dosage up or down  RECENT history:  Since it was unclear whether she has subclinical hypothyroidism or not she was told to stop her levothyroxine on her initial consultation in October 2020 Previously taking 50 mcg She does not feel any different with stopping her supplement and has no unusual fatigue, hair changes or weight change  TSH is now back up to 10.3 compared to 2.8         Patient's weight history is as follows:  Wt Readings from Last 3 Encounters:  05/07/19 186 lb 9.6 oz (84.6 kg)  03/04/19 186 lb 12.8 oz (84.7 kg)  01/23/18 189 lb (85.7 kg)    Thyroid function results have been as follows:  Earlier TSH in 11/2018 was 3.1  Lab Results  Component Value Date   TSH 10.30 (H) 04/29/2019   TSH 2.84 03/04/2019   TSH 2.375 04/01/2015   FREET4 0.87 04/29/2019   FREET4 1.03 03/04/2019    On her initial visit she was felt to have a possible left thyroid nodule on exam  THYROID ultrasound in 03/2019: Showed the following  Moderately heterogeneous thyroid gland without evidence for distinct thyroid nodule.   Past  Medical History:  Diagnosis Date  . ASCUS (atypical squamous cells of undetermined significance) on Pap smear 2009  . Daytime sleepiness   . Dyslipidemia   . Fibroid 2009  . Hydrosalpinx 2009  . Hyperlipidemia   . Hypertension   . Hypothyroidism   . Menorrhagia 2010  . Migraine    with menses  . Osteopenia 2006  . Osteoporosis 2006  . Palpitations   . Pelvic mass   . Snoring   . Systolic murmur 0000000  . Tachycardia 2013    Past Surgical History:  Procedure Laterality Date  . CESAREAN SECTION    . hysteroscopic resection    . TRANSTHORACIC ECHOCARDIOGRAM  07/22/2011   EF >55%, normal    Family History  Problem Relation Age of Onset  . Cancer Mother        premenopausal breast   . Anuerysm Mother        abdominal and cerebral  . Cancer Sister        premenopausal breast  . Hypertension Father   . Heart disease Father 69  . Anuerysm Sister     Social History:  reports that she has never smoked. She has never used smokeless tobacco. She reports current alcohol use. She reports that she does not use drugs.  Allergies:  Allergies  Allergen Reactions  . Multihance [Gadobenate Dimeglumine] Nausea And Vomiting    Pt has nausea and vomiting after injection of multihance for mri.    Allergies as of 05/07/2019      Reactions   Multihance [gadobenate Dimeglumine] Nausea And Vomiting   Pt has nausea and vomiting after injection of multihance for mri.      Medication List       Accurate as of May 07, 2019  9:03 AM. If you have any questions, ask your nurse or doctor.        STOP taking these medications   levothyroxine 50 MCG tablet Commonly known as: SYNTHROID Stopped by: Elayne Snare, MD     TAKE these medications   alendronate 70 MG tablet Commonly known as: FOSAMAX Take 70 mg by mouth every 7 (seven) days. Take with a full glass of water on an empty stomach.   atorvastatin 40 MG tablet Commonly known as: LIPITOR TAKE 1 TABLET BY MOUTH EVERY DAY    atovaquone-proguanil 250-100 MG Tabs tablet Commonly known as: MALARONE Take 1 tablet by mouth daily. Start 2 days prior to travel to malaria area, throughout travel and for 7 days upon return.   CALCIUM 500 + D PO Take 2 tablets by mouth daily.   EVENING PRIMROSE OIL PO Take by mouth daily.   IBUPROFEN PO Take by mouth as needed.   losartan 50 MG tablet Commonly known as: COZAAR Take 1 tablet by mouth daily.   MAGNESIUM PO Take by mouth daily.   multivitamin tablet Take 1 tablet by mouth daily.   propranolol ER 60 MG 24 hr capsule Commonly known as: INDERAL LA TAKE ONE CAPSULE BY MOUTH EVERY DAY          Review of Systems    She still gets occasional palpitations, not better with stopping levothyroxine        Examination:    BP 130/82 (BP Location: Left Arm, Patient Position: Sitting, Cuff Size: Normal)   Pulse 68   Ht 5' 2.5" (1.588 m)   Wt 186 lb 9.6 oz (84.6 kg)   SpO2 97%   BMI 33.59 kg/m     Assessment:  HYPOTHYROIDISM, likely subclinical  She has not had any symptoms even with stopping her levothyroxine but since TSH is just over 10 she does meet the criteria for supplementation with levothyroxine Also because of her family history likely has Hashimoto's thyroiditis and may show some progression over time  Has not had any significant thyroid enlargement, discussed results of her ultrasound which shows a heterogenous gland without nodules indicated above Hashimoto's thyroiditis  History of OSTEOPOROSIS on long-term therapy with Fosamax from gynecologist Again discussed lack of evidence for treating continuously for more than 5 to 7 years  PLAN:   Restart levothyroxine 50 mcg daily  She will discuss stopping Fosamax with her gynecologist next year   Follow-up in 6 months     05/07/2019, 9:03 AM     Note: This office note was prepared with Estate agent. Any transcriptional errors that result from  this process are unintentional.

## 2019-05-08 DIAGNOSIS — M94262 Chondromalacia, left knee: Secondary | ICD-10-CM | POA: Diagnosis not present

## 2019-05-08 DIAGNOSIS — M94261 Chondromalacia, right knee: Secondary | ICD-10-CM | POA: Diagnosis not present

## 2019-05-08 DIAGNOSIS — M25662 Stiffness of left knee, not elsewhere classified: Secondary | ICD-10-CM | POA: Diagnosis not present

## 2019-05-08 DIAGNOSIS — M6281 Muscle weakness (generalized): Secondary | ICD-10-CM | POA: Diagnosis not present

## 2019-05-15 DIAGNOSIS — M6281 Muscle weakness (generalized): Secondary | ICD-10-CM | POA: Diagnosis not present

## 2019-05-15 DIAGNOSIS — M94262 Chondromalacia, left knee: Secondary | ICD-10-CM | POA: Diagnosis not present

## 2019-05-15 DIAGNOSIS — M25662 Stiffness of left knee, not elsewhere classified: Secondary | ICD-10-CM | POA: Diagnosis not present

## 2019-05-15 DIAGNOSIS — M94261 Chondromalacia, right knee: Secondary | ICD-10-CM | POA: Diagnosis not present

## 2019-05-22 DIAGNOSIS — M94261 Chondromalacia, right knee: Secondary | ICD-10-CM | POA: Diagnosis not present

## 2019-05-22 DIAGNOSIS — M25662 Stiffness of left knee, not elsewhere classified: Secondary | ICD-10-CM | POA: Diagnosis not present

## 2019-05-22 DIAGNOSIS — M94262 Chondromalacia, left knee: Secondary | ICD-10-CM | POA: Diagnosis not present

## 2019-05-22 DIAGNOSIS — M6281 Muscle weakness (generalized): Secondary | ICD-10-CM | POA: Diagnosis not present

## 2019-05-29 DIAGNOSIS — M6281 Muscle weakness (generalized): Secondary | ICD-10-CM | POA: Diagnosis not present

## 2019-05-29 DIAGNOSIS — M25662 Stiffness of left knee, not elsewhere classified: Secondary | ICD-10-CM | POA: Diagnosis not present

## 2019-05-29 DIAGNOSIS — M94262 Chondromalacia, left knee: Secondary | ICD-10-CM | POA: Diagnosis not present

## 2019-05-29 DIAGNOSIS — M94261 Chondromalacia, right knee: Secondary | ICD-10-CM | POA: Diagnosis not present

## 2019-05-31 ENCOUNTER — Ambulatory Visit: Payer: PPO | Attending: Internal Medicine

## 2019-05-31 DIAGNOSIS — Z23 Encounter for immunization: Secondary | ICD-10-CM | POA: Insufficient documentation

## 2019-06-03 NOTE — Progress Notes (Signed)
   Covid-19 Vaccination Clinic  Name:  Susan Valencia    MRN: [id number] DOB: January 10, 1956  05/31/2019  Ms. Sites was observed post Covid-19 immunization for 15 minutes without incidence. She was provided with Vaccine Information Sheet and instruction to access the V-Safe system.   [name] was instructed to call 911 with any severe reactions post vaccine: Marland Kitchen Difficulty breathing  . Swelling of your face and throat  . A fast heartbeat  . A bad rash all over your body  . Dizziness and weakness    Immunizations Administered    Name Date Dose VIS Date Route   Moderna COVID-19 Vaccine 05/31/2019  1:06 PM 0.5 mL 04/09/2019 Intramuscular   Manufacturer: Moderna   Lot: [id number]   [id number]

## 2019-06-06 ENCOUNTER — Ambulatory Visit: Payer: PPO

## 2019-06-28 ENCOUNTER — Ambulatory Visit: Payer: PPO

## 2019-07-05 ENCOUNTER — Ambulatory Visit: Payer: PPO | Attending: Internal Medicine

## 2019-07-05 DIAGNOSIS — Z23 Encounter for immunization: Secondary | ICD-10-CM | POA: Insufficient documentation

## 2019-07-05 NOTE — Progress Notes (Signed)
   Covid-19 Vaccination Clinic  Name:  Susan Valencia    MRN: [id number] DOB: Aug 29, 1955  07/05/2019  [name] was observed post Covid-19 immunization for 15 minutes without incidence. She was provided with Vaccine Information Sheet and instruction to access the V-Safe system.   [name] was instructed to call 911 with any severe reactions post vaccine: Marland Kitchen Difficulty breathing  . Swelling of your face and throat  . A fast heartbeat  . A bad rash all over your body  . Dizziness and weakness    Immunizations Administered    Name Date Dose VIS Date Route   Moderna COVID-19 Vaccine 07/05/2019 11:46 AM 0.5 mL 04/09/2019 Intramuscular   Manufacturer: Moderna   Lot: [id number]   [id number]

## 2019-09-25 DIAGNOSIS — H40053 Ocular hypertension, bilateral: Secondary | ICD-10-CM | POA: Diagnosis not present

## 2019-10-21 ENCOUNTER — Telehealth: Payer: Self-pay

## 2019-10-21 MED ORDER — LEVOTHYROXINE SODIUM 50 MCG PO TABS: 50.0000 ug | ORAL_TABLET | Freq: Every day | ORAL | 3 refills | Status: DC

## 2019-10-21 NOTE — Addendum Note (Signed)
Addended by: Elayne Snare on: 10/21/2019 10:38 AM   Modules accepted: Orders

## 2019-10-21 NOTE — Telephone Encounter (Signed)
Levothyroxine has been called in

## 2019-10-21 NOTE — Telephone Encounter (Signed)
Patient called today requesting a refill on levothyroxine and after checking the patients medication list I do not see that this was called in for her-patient stated another provider called this in before but was told Dr. Dwyane Dee would refill this and her old provider will not-patient would like a call back when this has been done-good call back number is [phone] please send to  [facility] [place], [place] - [place] Phone:  [phone]  Fax:  [phone]     Patient has upcoming appointment on 10/31/19

## 2019-10-21 NOTE — Telephone Encounter (Signed)
Noted  

## 2019-10-28 ENCOUNTER — Other Ambulatory Visit (INDEPENDENT_AMBULATORY_CARE_PROVIDER_SITE_OTHER): Payer: PPO

## 2019-10-28 ENCOUNTER — Other Ambulatory Visit: Payer: Self-pay

## 2019-10-28 DIAGNOSIS — E063 Autoimmune thyroiditis: Secondary | ICD-10-CM

## 2019-10-28 LAB — T4, FREE: Free T4: 0.99 ng/dL (ref 0.60–1.60)

## 2019-10-28 LAB — TSH: TSH: 3.23 u[IU]/mL (ref 0.35–4.50)

## 2019-10-31 ENCOUNTER — Ambulatory Visit: Payer: PPO | Admitting: Endocrinology

## 2019-10-31 ENCOUNTER — Encounter: Payer: Self-pay | Admitting: Endocrinology

## 2019-10-31 ENCOUNTER — Other Ambulatory Visit: Payer: Self-pay

## 2019-10-31 VITALS — BP 122/76 | HR 65 | Ht 63.0 in | Wt 187.4 lb

## 2019-10-31 DIAGNOSIS — E063 Autoimmune thyroiditis: Secondary | ICD-10-CM

## 2019-10-31 NOTE — Progress Notes (Signed)
Patient ID: Susan Valencia, female   DOB: 02-Aug-1955, 64 y.o.   MRN: [id number]            Referring Provider: Kendall Valencia  Reason for Appointment:  Hypothyroidism, follow-up visit    History of Present Illness:   Hypothyroidism was first diagnosed in 2017  At the time of diagnosis patient was not having any symptoms of  fatigue, cold sensitivity, difficulty concentrating, dry skin or hair loss .      She was having some difficulty losing weight as before She thinks her labs were tested because of family history of thyroid disease as well as routine annual visits       The patient has been treated with  either 25 or 50 mcg of levothyroxine since 2017 She thinks that her initial dose may have been excessive and she was told to cut back With starting thyroid supplementation she did not feel any different and also had not felt any different with adjusting her dosage up or down  RECENT history:  Since it was unclear whether she has subclinical hypothyroidism or not she was told to stop her 50 mcg levothyroxine on her initial consultation in October 2020 She did not feel any different with stopping her supplement but her TSH was back up to 10.3  She is back on her levothyroxine 50 mcg daily She takes this daily before breakfast Recently complains of no unusual fatigue, lethargy or weight change  TSH is now back to 3.2         Patient's weight history is as follows:  Wt Readings from Last 3 Encounters:  10/31/19 187 lb 6.4 oz (85 kg)  05/07/19 186 lb 9.6 oz (84.6 kg)  03/04/19 186 lb 12.8 oz (84.7 kg)    Thyroid function results have been as follows:  Earlier TSH in 11/2018 was 3.1  Lab Results  Component Value Date   TSH 3.23 10/28/2019   TSH 10.30 (H) 04/29/2019   TSH 2.84 03/04/2019   TSH 2.375 04/01/2015   FREET4 0.99 10/28/2019   FREET4 0.87 04/29/2019   FREET4 1.03 03/04/2019    On her initial visit she was felt to have a possible left thyroid nodule on  exam  THYROID ultrasound in 03/2019: Showed the following  Moderately heterogeneous thyroid gland without evidence for distinct thyroid nodule.   Past Medical History:  Diagnosis Date   ASCUS (atypical squamous cells of undetermined significance) on Pap smear 2009   Daytime sleepiness    Dyslipidemia    Fibroid 2009   Hydrosalpinx 2009   Hyperlipidemia    Hypertension    Hypothyroidism    Menorrhagia 2010   Migraine    with menses   Osteopenia 2006   Osteoporosis 2006   Palpitations    Pelvic mass    Snoring    Systolic murmur 5361   Tachycardia 2013    Past Surgical History:  Procedure Laterality Date   CESAREAN SECTION     hysteroscopic resection     TRANSTHORACIC ECHOCARDIOGRAM  07/22/2011   EF >55%, normal    Family History  Problem Relation Age of Onset   Cancer Mother        premenopausal breast    Anuerysm Mother        abdominal and cerebral   Cancer Sister        premenopausal breast   Hypertension Father    Heart disease Father 27   Anuerysm Sister     Social History:  reports that she has never smoked. She has never used smokeless tobacco. She reports current alcohol use. She reports that she does not use drugs.  Allergies:  Allergies  Allergen Reactions   Multihance [Gadobenate Dimeglumine] Nausea And Vomiting    Pt has nausea and vomiting after injection of multihance for mri.    Allergies as of 10/31/2019      Reactions   Multihance [gadobenate Dimeglumine] Nausea And Vomiting   Pt has nausea and vomiting after injection of multihance for mri.      Medication List       Accurate as of October 31, 2019  9:14 AM. If you have any questions, ask your nurse or doctor.        STOP taking these medications   propranolol ER 60 MG 24 hr capsule Commonly known as: INDERAL LA Stopped by: Susan Snare, MD     TAKE these medications   alendronate 70 MG tablet Commonly known as: FOSAMAX Take 70 mg by mouth every 7  (seven) days. Take with a full glass of water on an empty stomach.   atorvastatin 40 MG tablet Commonly known as: LIPITOR TAKE 1 TABLET BY MOUTH EVERY DAY   atovaquone-proguanil 250-100 MG Tabs tablet Commonly known as: MALARONE Take 1 tablet by mouth daily. Start 2 days prior to travel to malaria area, throughout travel and for 7 days upon return.   CALCIUM 500 + D PO Take 2 tablets by mouth daily.   EVENING PRIMROSE OIL PO Take by mouth daily.   IBUPROFEN PO Take by mouth as needed.   levothyroxine 50 MCG tablet Commonly known as: SYNTHROID Take 1 tablet (50 mcg total) by mouth daily.   losartan 50 MG tablet Commonly known as: COZAAR Take 1 tablet by mouth daily.   MAGNESIUM PO Take by mouth daily.   multivitamin tablet Take 1 tablet by mouth daily.          Review of Systems   She still gets occasional palpitations and is going to see a cardiologist  History of OSTEOPOROSIS on long-term therapy with Fosamax from gynecologist   Examination:    BP 122/76 (BP Location: Left Arm, Patient Position: Sitting, Cuff Size: Large)    Pulse 65    Ht 5\' 3"  (1.6 m)    Wt 187 lb 6.4 oz (85 kg)    SpO2 99%    BMI 33.20 kg/m   Thyroid not palpable  Assessment:  HYPOTHYROIDISM, subclinical  She has not had any symptoms even with her TSH being about 10 However she likely has Hashimoto's thyroiditis as seen on her ultrasound of the thyroid Because of her TSH being 10+ she should remain on her 50 mcg dose of levothyroxine  Her levothyroxine but since TSH is just over 10 she does meet the criteria for supplementation with levothyroxine   PLAN:   Follow-up in 1 year on the same dose   Susan Valencia 10/31/2019, 9:14 AM     Note: This office note was prepared with Dragon voice recognition system technology. Any transcriptional errors that result from this process are unintentional.

## 2019-12-04 DIAGNOSIS — Z1231 Encounter for screening mammogram for malignant neoplasm of breast: Secondary | ICD-10-CM | POA: Diagnosis not present

## 2019-12-26 ENCOUNTER — Encounter: Payer: Self-pay | Admitting: Genetic Counselor

## 2020-01-07 ENCOUNTER — Encounter: Payer: Self-pay | Admitting: Internal Medicine

## 2020-01-07 ENCOUNTER — Ambulatory Visit: Payer: PPO | Admitting: Internal Medicine

## 2020-01-07 ENCOUNTER — Other Ambulatory Visit: Payer: Self-pay

## 2020-01-07 VITALS — BP 146/88 | HR 62 | Ht 63.0 in | Wt 189.0 lb

## 2020-01-07 DIAGNOSIS — E782 Mixed hyperlipidemia: Secondary | ICD-10-CM | POA: Diagnosis not present

## 2020-01-07 DIAGNOSIS — R002 Palpitations: Secondary | ICD-10-CM | POA: Diagnosis not present

## 2020-01-07 DIAGNOSIS — I10 Essential (primary) hypertension: Secondary | ICD-10-CM | POA: Diagnosis not present

## 2020-01-07 DIAGNOSIS — G4733 Obstructive sleep apnea (adult) (pediatric): Secondary | ICD-10-CM | POA: Diagnosis not present

## 2020-01-07 MED ORDER — PROPRANOLOL HCL 60 MG PO TABS: 60.0000 mg | ORAL_TABLET | Freq: Every day | ORAL | 11 refills | Status: DC

## 2020-01-07 MED ORDER — LOSARTAN POTASSIUM 50 MG PO TABS: 50.0000 mg | ORAL_TABLET | Freq: Every day | ORAL | 11 refills | Status: DC

## 2020-01-07 MED ORDER — ATORVASTATIN CALCIUM 40 MG PO TABS: 40.0000 mg | ORAL_TABLET | Freq: Every day | ORAL | 11 refills | Status: DC

## 2020-01-07 NOTE — Progress Notes (Signed)
OFFICE NOTE  Chief Complaint:  Palpitations  Primary Care Physician: Fanny Bien, MD  HPI:  Susan Valencia is a 64 yo female with a history of palpitations, elevated BP, migraine headaches and soft flow murmur. After our first visit, I elected to start her on low dose propranolol XR 60 mg daily. She reported a marked improvement in her palpitations and almost a complete disappearance of her migraine headaches.  Since that time, she was getting some residual palpitations, only in the late evening. She then switched her medication to QHS dosing and noted an improvement. Today she is pretty much without complaints.  Susan Valencia returns today for follow-up. Overall she seems to be doing very well. She reports a propranolol works well for her palpitations and affect his done a good job of keeping her from having migraine headaches. She's had no side effects from Lipitor and is due for repeat testing. She denies any chest pain or worsening shortness of breath. Her only main concerns now are hot flashes and symptoms of being perimenopausal.  04/06/2016  Susan Valencia was seen today in follow-up. Overall she feels fairly well. She is still having some intermittent hot flashes. She says that her propranolol generally works for her palpitations although the other day she had palpitations mostly through one night. Her heart rate got up into the low 80s according to her fit but watch. She denies any fatigue with these episodes. Recently blood pressure was noted to be increased. Her primary care provider put her on low-dose losartan. This seems to be working for her. She is due for repeat testing of her lipid profile which was well controlled year ago. Weight is up about 11 pounds since her last office visit. She was recently found to be mildly hypothyroid and is on low-dose levothyroxine.  03/08/2017  Susan Valencia returns today for follow-up.  She reports some occasional palpitations that are breaking  through her propranolol.  She has well-controlled TSH and is on alternating levothyroxine 50/25 mcg every other day.  She also takes atorvastatin and losartan.  Blood pressures well controlled today at 130/82.  Thyroid and cholesterol followed by her primary care provider and were performed recently.  10/03/2017  Susan Valencia returns today for follow-up.  She again gets some palpitations, mostly in the evening and in the middle the night.  She recently saw her PCP who referred her for a sleep study however her insurance would not cover her study without a home study.  That is scheduled next week.  She denies any chest pain or worsening shortness of breath.  Blood pressure is reasonably well controlled.  Thyroid function is been normal.  01/07/2020  Susan Valencia returns today for follow-up. Overall she reports good control of her blood pressure which has been about the 120s. Today was elevated somewhat. She has had palpitations but is generally at night when she lays on the left side. She feels like her control is pretty good with propranolol. She is also on some losartan. She struggled with sleep apnea and had 16 AHI during her sleep study but cannot tolerate CPAP. She is considering an oral appliance.  PMHx:  Past Medical History:  Diagnosis Date  . ASCUS (atypical squamous cells of undetermined significance) on Pap smear 2009  . Daytime sleepiness   . Dyslipidemia   . Fibroid 2009  . Hydrosalpinx 2009  . Hyperlipidemia   . Hypertension   . Hypothyroidism   . Menorrhagia 2010  . Migraine  with menses  . Osteopenia 2006  . Osteoporosis 2006  . Palpitations   . Pelvic mass   . Snoring   . Systolic murmur 2778  . Tachycardia 2013    Past Surgical History:  Procedure Laterality Date  . CESAREAN SECTION    . hysteroscopic resection    . TRANSTHORACIC ECHOCARDIOGRAM  07/22/2011   EF >55%, normal    FAMHx:  Family History  Problem Relation Age of Onset  . Cancer Mother         premenopausal breast   . Anuerysm Mother        abdominal and cerebral  . Cancer Sister        premenopausal breast  . Hypertension Father   . Heart disease Father 53  . Anuerysm Sister     SOCHx:   reports that she has never smoked. She has never used smokeless tobacco. She reports current alcohol use. She reports that she does not use drugs.  ALLERGIES:  Allergies  Allergen Reactions  . Multihance [Gadobenate Dimeglumine] Nausea And Vomiting    Pt has nausea and vomiting after injection of multihance for mri.    ROS: Pertinent items noted in HPI and remainder of comprehensive ROS otherwise negative.  HOME MEDS: Current Outpatient Medications  Medication Sig Dispense Refill  . alendronate (FOSAMAX) 70 MG tablet Take 70 mg by mouth every 7 (seven) days. Take with a full glass of water on an empty stomach.    Marland Kitchen atorvastatin (LIPITOR) 40 MG tablet TAKE 1 TABLET BY MOUTH EVERY DAY 30 tablet 11  . atovaquone-proguanil (MALARONE) 250-100 MG TABS tablet Take 1 tablet by mouth daily. Start 2 days prior to travel to malaria area, throughout travel and for 7 days upon return. 23 tablet 0  . Calcium Carbonate-Vitamin D (CALCIUM 500 + D PO) Take 2 tablets by mouth daily.    Marland Kitchen EVENING PRIMROSE OIL PO Take by mouth daily.    . IBUPROFEN PO Take by mouth as needed.    Marland Kitchen levothyroxine (SYNTHROID) 50 MCG tablet Take 1 tablet (50 mcg total) by mouth daily. 90 tablet 3  . losartan (COZAAR) 50 MG tablet Take 1 tablet by mouth daily.  0  . MAGNESIUM PO Take 400 mg by mouth daily.     . Melatonin 2.5 MG CAPS Take 1 capsule by mouth at bedtime as needed. Take 1 tablet by mouth once daily at bedtime as needed for sleep.    . Multiple Vitamin (MULTIVITAMIN) tablet Take 1 tablet by mouth daily.    . propranolol (INDERAL) 60 MG tablet Take 60 mg by mouth daily.     No current facility-administered medications for this visit.    LABS/IMAGING: No results found for this or any previous visit (from the  past 48 hour(s)). No results found.  VITALS: BP (!) 146/88   Pulse 62   Ht 5\' 3"  (1.6 m)   Wt 189 lb (85.7 kg)   SpO2 98%   BMI 33.48 kg/m   EXAM: General appearance: alert and no distress Neck: no carotid bruit, no JVD and thyroid not enlarged, symmetric, no tenderness/mass/nodules Lungs: clear to auscultation bilaterally Heart: regular rate and rhythm, S1, S2 normal, no murmur, click, rub or gallop Abdomen: soft, non-tender; bowel sounds normal; no masses,  no organomegaly Extremities: extremities normal, atraumatic, no cyanosis or edema Pulses: 2+ and symmetric Skin: Skin color, texture, turgor normal. No rashes or lesions Neurologic: Grossly normal Psych: Pleasant  EKG: Normal sinus rhythm 62-personally reviewed  ASSESSMENT: 1. Palpitations - mostly controlled on propranolol 2. Migraine headaches - improved with b-blocker 3. Dyslipidemia-stable on Lipitor 4. Essential hypertension 5. Hypothyroidism 6. OSA not on CPAP  PLAN: 1.   Susan Valencia reports reasonably good control of her palpitations except when laying on her left side at night. Sometimes she is awakened with tachycardia which I suspect is a function of her sleep apnea. Unfortunate she cannot tolerate CPAP. She may do well with an oral appliance and she is considering that with her dentist. We will continue her current medicines however she has more breakthrough palpitations we could increase her propranolol or consider another beta-blocker. Blood pressure was up a little today but is generally been well controlled at home. No changes to her medicines. Follow-up with me annually or sooner as necessary.  Pixie Casino, MD, [facility], [place] Director of the [facility] Diplomate of the American Board of Clinical Lipidology Attending Cardiologist  Direct Dial: [phone]  Fax: [phone]  Website:   [email or web address]  01/07/2020, 1:38 PM

## 2020-01-07 NOTE — Patient Instructions (Signed)
Medication Instructions:  Refilled Cardiac Medications *If you need a refill on your cardiac medications before your next appointment, please call your pharmacy*  Lab Work: None Ordered At This Time.   If you have labs (blood work) drawn today and your tests are completely normal, you will receive your results only by: Marland Kitchen MyChart Message (if you have MyChart) OR . A paper copy in the mail If you have any lab test that is abnormal or we need to change your treatment, we will call you to review the results.   Testing/Procedures: None Ordered At This Time.   Follow-Up: At [facility], you and your health needs are our priority.  As part of our continuing mission to provide you with exceptional heart care, we have created designated Provider Care Teams.  These Care Teams include your primary Cardiologist (physician) and Advanced Practice Providers (APPs -  Physician Assistants and Nurse Practitioners) who all work together to provide you with the care you need, when you need it.  Your next appointment:   1 year(s)  The format for your next appointment:   In Person  Provider:   K. Mali Hilty, MD

## 2020-01-10 ENCOUNTER — Telehealth: Payer: Self-pay | Admitting: Internal Medicine

## 2020-01-10 MED ORDER — PROPRANOLOL HCL ER 60 MG PO CP24: 60.0000 mg | ORAL_CAPSULE | Freq: Every day | ORAL | 3 refills | Status: DC

## 2020-01-10 NOTE — Telephone Encounter (Signed)
Spoke with pt, aware new script sent to the pharmacy.

## 2020-01-10 NOTE — Telephone Encounter (Signed)
Pt c/o medication issue:  1. Name of Medication: propranolol (INDERAL) 60 MG tablet  2. How are you currently taking this medication (dosage and times per day)? 1 capsule by mouth daily   3. Are you having a reaction (difficulty breathing--STAT)? No   4. What is your medication issue? Susan Valencia is calling stating when she picked up her prescription it was different than her previous. She states she was originally taking extended release capsules and now they are tablets that are not extended release. Please advise.

## 2020-03-09 DIAGNOSIS — R682 Dry mouth, unspecified: Secondary | ICD-10-CM | POA: Diagnosis not present

## 2020-03-09 DIAGNOSIS — I10 Essential (primary) hypertension: Secondary | ICD-10-CM | POA: Diagnosis not present

## 2020-03-09 DIAGNOSIS — E7800 Pure hypercholesterolemia, unspecified: Secondary | ICD-10-CM | POA: Diagnosis not present

## 2020-03-09 DIAGNOSIS — M810 Age-related osteoporosis without current pathological fracture: Secondary | ICD-10-CM | POA: Diagnosis not present

## 2020-03-09 DIAGNOSIS — E039 Hypothyroidism, unspecified: Secondary | ICD-10-CM | POA: Diagnosis not present

## 2020-03-09 DIAGNOSIS — Z6833 Body mass index (BMI) 33.0-33.9, adult: Secondary | ICD-10-CM | POA: Diagnosis not present

## 2020-03-09 DIAGNOSIS — R143 Flatulence: Secondary | ICD-10-CM | POA: Diagnosis not present

## 2020-03-09 DIAGNOSIS — Z23 Encounter for immunization: Secondary | ICD-10-CM | POA: Diagnosis not present

## 2020-03-09 DIAGNOSIS — N39498 Other specified urinary incontinence: Secondary | ICD-10-CM | POA: Diagnosis not present

## 2020-03-09 DIAGNOSIS — E669 Obesity, unspecified: Secondary | ICD-10-CM | POA: Diagnosis not present

## 2020-04-09 DIAGNOSIS — Z01419 Encounter for gynecological examination (general) (routine) without abnormal findings: Secondary | ICD-10-CM | POA: Diagnosis not present

## 2020-04-09 DIAGNOSIS — Z6833 Body mass index (BMI) 33.0-33.9, adult: Secondary | ICD-10-CM | POA: Diagnosis not present

## 2020-04-09 DIAGNOSIS — Z1231 Encounter for screening mammogram for malignant neoplasm of breast: Secondary | ICD-10-CM | POA: Diagnosis not present

## 2020-04-09 DIAGNOSIS — Z1211 Encounter for screening for malignant neoplasm of colon: Secondary | ICD-10-CM | POA: Diagnosis not present

## 2020-05-07 IMAGING — US US THYROID
1 series · 14 of 25 positions shown · non-contrast
Comparison: None.

CLINICAL DATA: Thyroid nodules

EXAM:
THYROID ULTRASOUND
TECHNIQUE: Ultrasound examination of the thyroid gland and adjacent soft
tissues was performed.

[Series 1: us thyroid · 0.06mm/px · 14 of 43 slices shown]
[im 1/43]
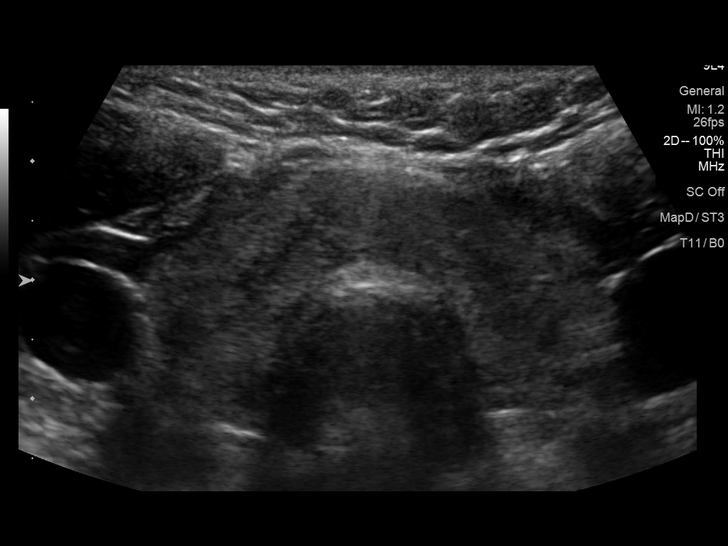
[im 4/43]
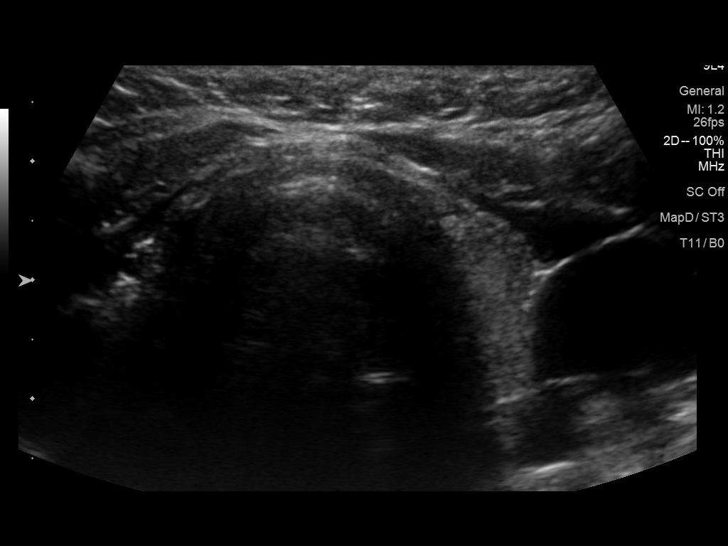
[im 8/43]
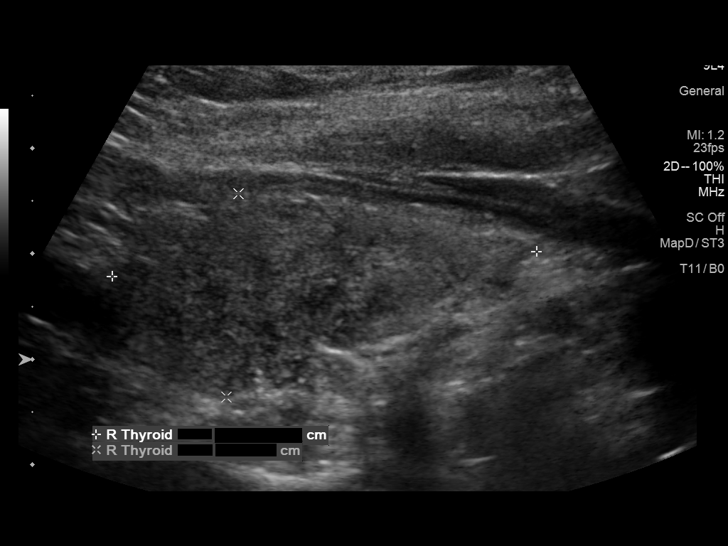
[im 11/43]
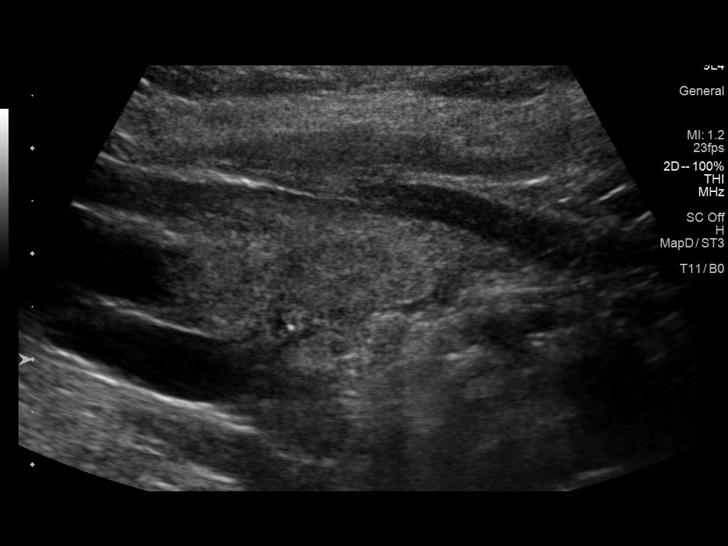
[im 15/43]
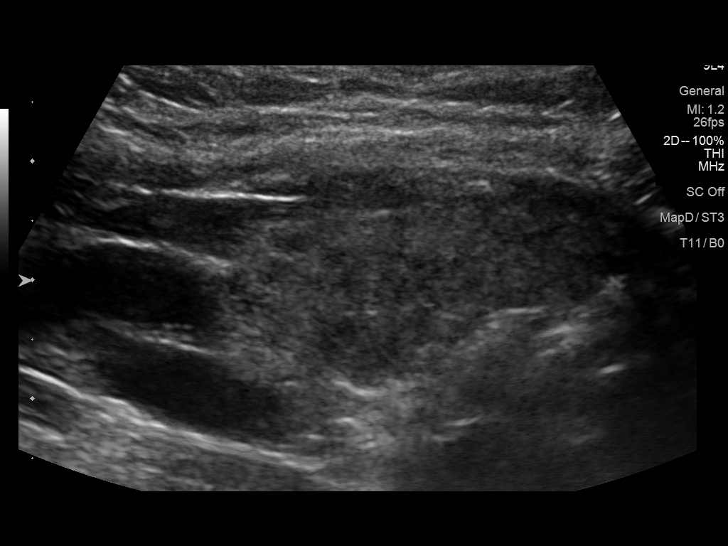
[im 16/43]
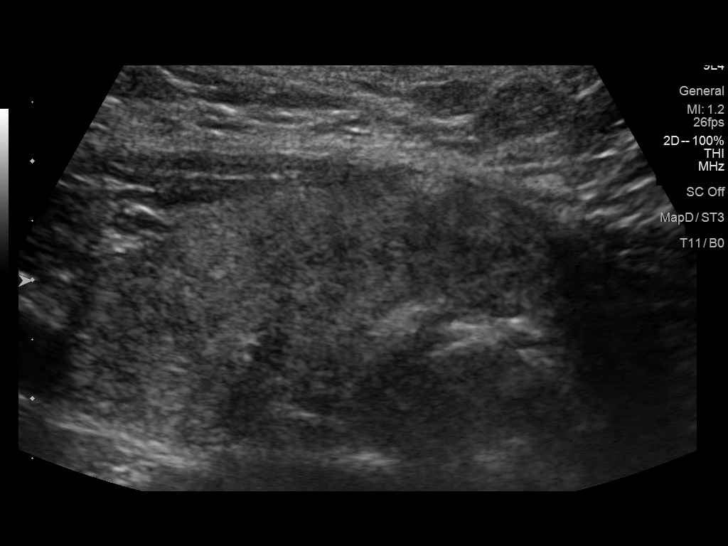
[im 20/43]
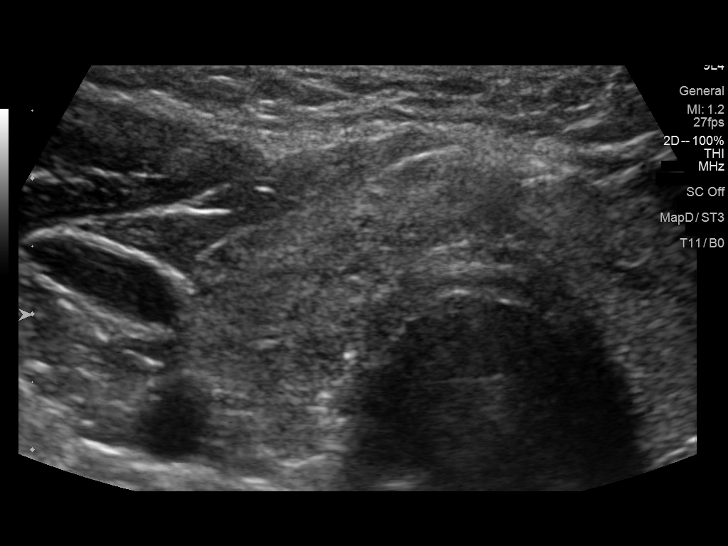
[im 23/43]
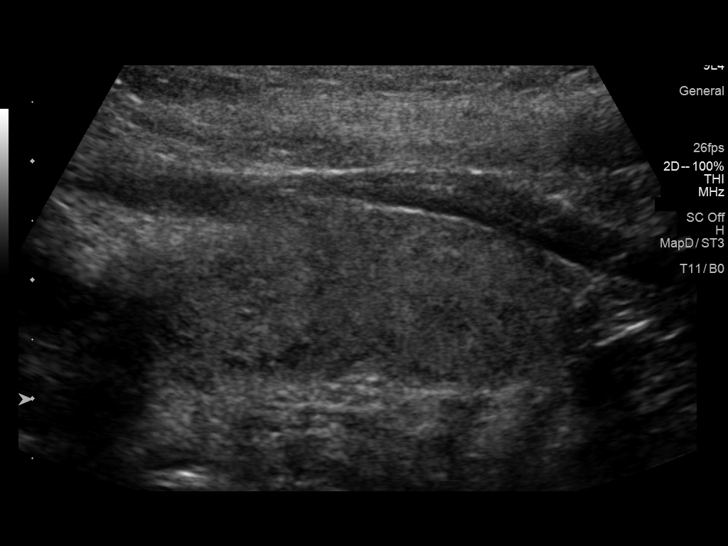
[im 27/43]
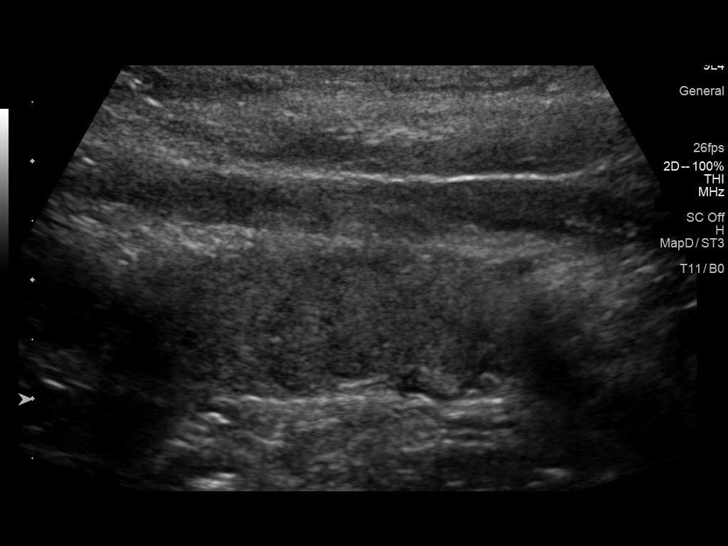
[im 29/43]
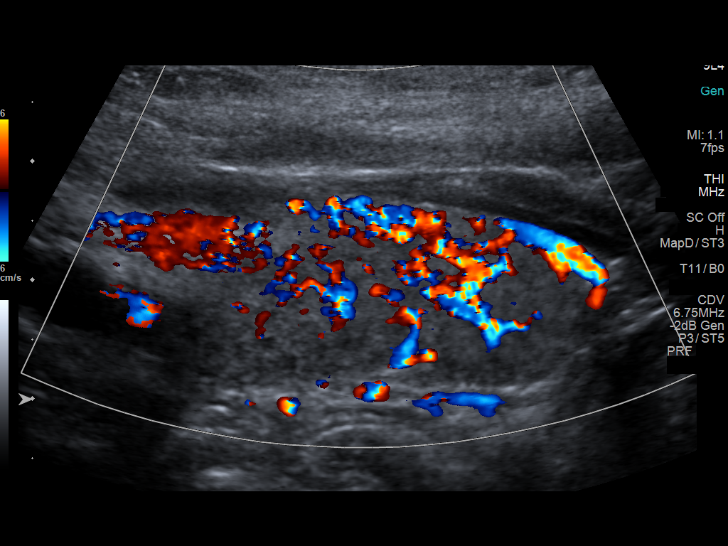
[im 32/43]
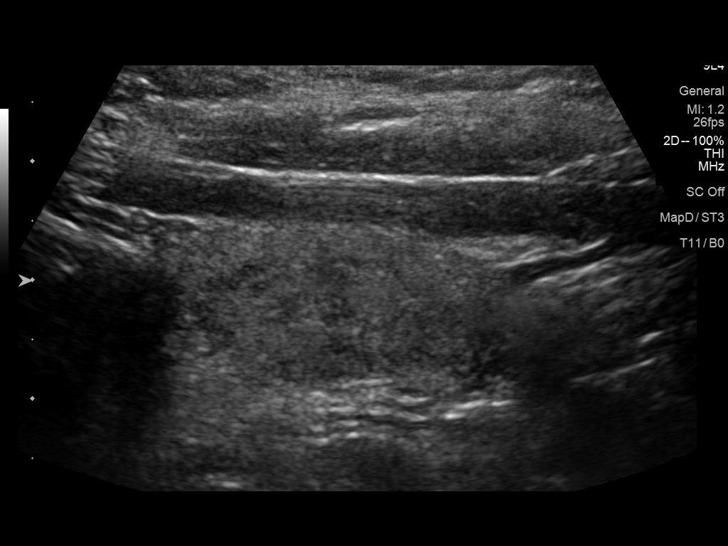
[im 36/43]
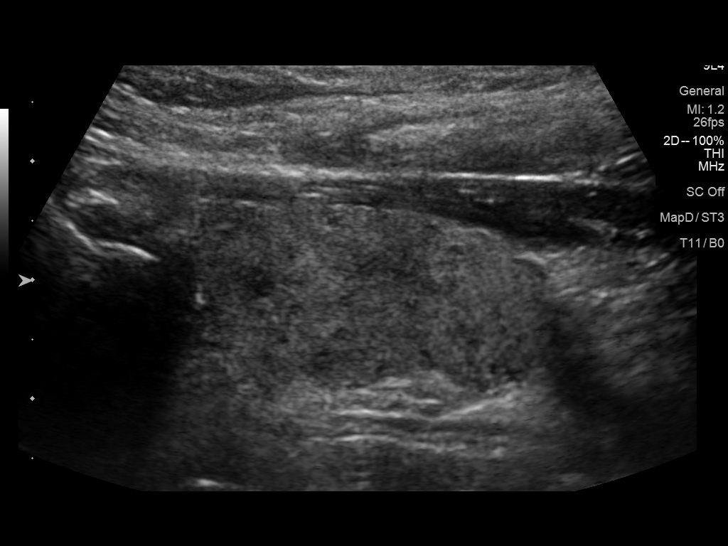
[im 39/43]
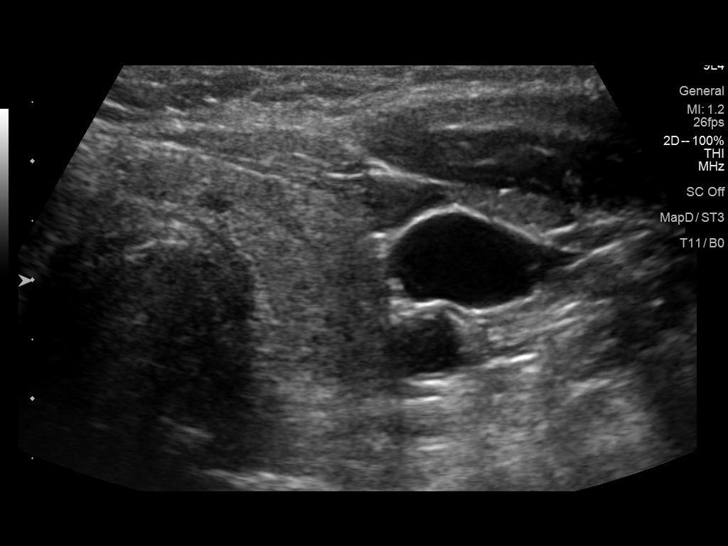
[im 43/43]
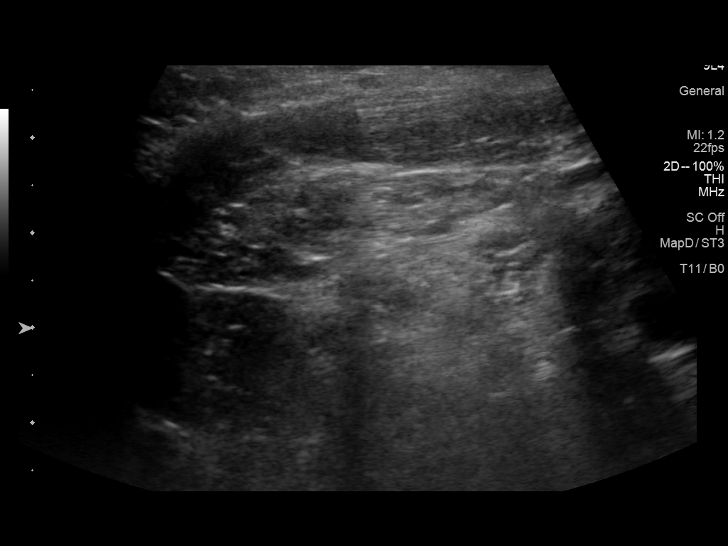

[14 of 25 positions shown; findings below may reference images not displayed]

FINDINGS: Parenchymal Echotexture: Moderately heterogenous

Isthmus: 0.7 cm

Right lobe: 4 x 1.9 x 1.7 cm

Left lobe: 4.4 x 1.4 x 1.2 cm

_________________________________________________________

Estimated total number of nodules >/= 1 cm: 0

Number of spongiform nodules >/=  2 cm not described below (TR1): 0

Number of mixed cystic and solid nodules >/= 1.5 cm not described
below (TR2): 0

_________________________________________________________

No discrete nodules are seen within the thyroid gland.
IMPRESSION: Moderately heterogeneous thyroid gland without evidence for distinct
thyroid nodule.

The above is in keeping with the ACR TI-RADS recommendations - [HOSPITAL] 2867;[DATE].

## 2020-08-01 ENCOUNTER — Other Ambulatory Visit: Payer: Self-pay | Admitting: Endocrinology

## 2020-11-03 ENCOUNTER — Other Ambulatory Visit: Payer: Self-pay | Admitting: Endocrinology

## 2020-11-03 ENCOUNTER — Other Ambulatory Visit: Payer: Self-pay

## 2020-11-03 ENCOUNTER — Other Ambulatory Visit (INDEPENDENT_AMBULATORY_CARE_PROVIDER_SITE_OTHER): Payer: PPO

## 2020-11-03 ENCOUNTER — Other Ambulatory Visit: Payer: PPO

## 2020-11-03 DIAGNOSIS — E041 Nontoxic single thyroid nodule: Secondary | ICD-10-CM

## 2020-11-03 LAB — T4, FREE: Free T4: 0.97 ng/dL (ref 0.60–1.60)

## 2020-11-03 LAB — TSH: TSH: 1.88 u[IU]/mL (ref 0.35–4.50)

## 2020-11-05 ENCOUNTER — Ambulatory Visit (INDEPENDENT_AMBULATORY_CARE_PROVIDER_SITE_OTHER): Payer: PPO | Admitting: Endocrinology

## 2020-11-05 ENCOUNTER — Encounter: Payer: Self-pay | Admitting: Endocrinology

## 2020-11-05 ENCOUNTER — Other Ambulatory Visit: Payer: Self-pay

## 2020-11-05 VITALS — BP 124/82 | HR 86 | Ht 62.5 in | Wt 190.0 lb

## 2020-11-05 DIAGNOSIS — E063 Autoimmune thyroiditis: Secondary | ICD-10-CM | POA: Diagnosis not present

## 2020-11-05 NOTE — Progress Notes (Signed)
Patient ID: Susan Valencia, female   DOB: 1955-11-23, 65 y.o.   MRN: [id number]            Referring Provider: Kendall Flack  Reason for Appointment:  Hypothyroidism, follow-up visit    History of Present Illness:   Hypothyroidism was first diagnosed in 2017  At the time of diagnosis patient was not having any symptoms of  fatigue, cold sensitivity, difficulty concentrating, dry skin or hair loss .      She was having some difficulty losing weight as before She thinks her labs were tested because of family history of thyroid disease as well as routine annual visits       The patient has been treated with  either 25 or 50 mcg of levothyroxine since 2017 She thinks that her initial dose may have been excessive and she was told to cut back With starting thyroid supplementation she did not feel any different and also had not felt any different with adjusting her dosage up or down  RECENT history:  Since it was unclear whether she has subclinical hypothyroidism or not she was told to stop her 50 mcg levothyroxine on her initial consultation in October 2020 She did not feel any different with stopping her supplement but her TSH was back up to 10.3 as of 04/2019  She is consistently on levothyroxine 50 mcg daily Did not feel any different with starting the supplement  She takes this daily before breakfast Again she feels fairly good with her energy level Weight is about the same  TSH is consistently normal now         Patient's weight history is as follows:  Wt Readings from Last 3 Encounters:  11/05/20 190 lb (86.2 kg)  01/07/20 189 lb (85.7 kg)  10/31/19 187 lb 6.4 oz (85 kg)    Thyroid function results have been as follows:   Lab Results  Component Value Date   TSH 1.88 11/03/2020   TSH 3.23 10/28/2019   TSH 10.30 (H) 04/29/2019   TSH 2.84 03/04/2019   FREET4 0.97 11/03/2020   FREET4 0.99 10/28/2019   FREET4 0.87 04/29/2019   FREET4 1.03 03/04/2019    On her  initial visit she was felt to have a possible left thyroid nodule on exam  THYROID ultrasound in 03/2019: Showed the following  Moderately heterogeneous thyroid gland without evidence for distinct thyroid nodule.   Past Medical History:  Diagnosis Date   ASCUS (atypical squamous cells of undetermined significance) on Pap smear 2009   Daytime sleepiness    Dyslipidemia    Fibroid 2009   Hydrosalpinx 2009   Hyperlipidemia    Hypertension    Hypothyroidism    Menorrhagia 2010   Migraine    with menses   Osteopenia 2006   Osteoporosis 2006   Palpitations    Pelvic mass    Snoring    Systolic murmur 7253   Tachycardia 2013    Past Surgical History:  Procedure Laterality Date   CESAREAN SECTION     hysteroscopic resection     TRANSTHORACIC ECHOCARDIOGRAM  07/22/2011   EF >55%, normal    Family History  Problem Relation Age of Onset   Cancer Mother        premenopausal breast    Anuerysm Mother        abdominal and cerebral   Cancer Sister        premenopausal breast   Hypertension Father    Heart disease Father 40  Anuerysm Sister     Social History:  reports that she has never smoked. She has never used smokeless tobacco. She reports current alcohol use. She reports that she does not use drugs.  Allergies:  Allergies  Allergen Reactions   Multihance [Gadobenate Dimeglumine] Nausea And Vomiting    Pt has nausea and vomiting after injection of multihance for mri.    Allergies as of 11/05/2020       Reactions   Multihance [gadobenate Dimeglumine] Nausea And Vomiting   Pt has nausea and vomiting after injection of multihance for mri.        Medication List        Accurate as of November 05, 2020 10:22 AM. If you have any questions, ask your nurse or doctor.          alendronate 70 MG tablet Commonly known as: FOSAMAX Take 70 mg by mouth every 7 (seven) days. Take with a full glass of water on an empty stomach.   atorvastatin 40 MG tablet Commonly  known as: LIPITOR Take 1 tablet (40 mg total) by mouth daily.   CALCIUM 500 + D PO Take 2 tablets by mouth daily.   EVENING PRIMROSE OIL PO Take by mouth daily.   IBUPROFEN PO Take by mouth as needed.   levothyroxine 50 MCG tablet Commonly known as: SYNTHROID TAKE 1 TABLET(50 MCG) BY MOUTH DAILY   losartan 50 MG tablet Commonly known as: COZAAR Take 1 tablet (50 mg total) by mouth daily.   MAGNESIUM PO Take 400 mg by mouth daily.   Melatonin 2.5 MG Caps Take 1 capsule by mouth at bedtime as needed. Take 1 tablet by mouth once daily at bedtime as needed for sleep.   multivitamin tablet Take 1 tablet by mouth daily.   propranolol ER 60 MG 24 hr capsule Commonly known as: INDERAL LA Take 1 capsule (60 mg total) by mouth daily.           Review of Systems   She still gets occasional palpitations and is still having some palpitations at night despite taking propranolol  History of OSTEOPOROSIS on long-term therapy with Fosamax from gynecologist   Examination:    BP 124/82   Pulse 86   Ht 5' 2.5" (1.588 m)   Wt 190 lb (86.2 kg)   SpO2 99%   BMI 34.20 kg/m   Thyroid not palpable Biceps reflexes appear to be normal Pulse is regular, about 76/min  Assessment:  HYPOTHYROIDISM, subclinical  She has not had any symptoms even with her TSH at baseline about 10 Does likely have Hashimoto's thyroiditis as seen on her ultrasound of the thyroid Because of her TSH being 10+ she should remain on her 50 mcg dose of levothyroxine TSH is now consistently normal  No goiter on exam, previously had heterogenous appearance of thyroid on ultrasound   PLAN:   Follow-up in 1 year on the same dose   Elayne Snare 11/05/2020, 10:22 AM     Note: This office note was prepared with Dragon voice recognition system technology. Any transcriptional errors that result from this process are unintentional.

## 2020-11-06 DIAGNOSIS — H40053 Ocular hypertension, bilateral: Secondary | ICD-10-CM | POA: Diagnosis not present

## 2020-12-09 DIAGNOSIS — Z803 Family history of malignant neoplasm of breast: Secondary | ICD-10-CM | POA: Diagnosis not present

## 2020-12-09 DIAGNOSIS — Z1231 Encounter for screening mammogram for malignant neoplasm of breast: Secondary | ICD-10-CM | POA: Diagnosis not present

## 2020-12-29 ENCOUNTER — Other Ambulatory Visit: Payer: Self-pay | Admitting: Internal Medicine

## 2021-01-12 DIAGNOSIS — Z20822 Contact with and (suspected) exposure to covid-19: Secondary | ICD-10-CM | POA: Diagnosis not present

## 2021-01-28 ENCOUNTER — Other Ambulatory Visit: Payer: Self-pay | Admitting: Internal Medicine

## 2021-01-28 ENCOUNTER — Other Ambulatory Visit: Payer: Self-pay | Admitting: Endocrinology

## 2021-02-27 ENCOUNTER — Other Ambulatory Visit: Payer: Self-pay | Admitting: Internal Medicine

## 2021-03-08 ENCOUNTER — Encounter: Payer: Self-pay | Admitting: Internal Medicine

## 2021-03-08 ENCOUNTER — Ambulatory Visit: Payer: PPO | Admitting: Internal Medicine

## 2021-03-08 ENCOUNTER — Other Ambulatory Visit: Payer: Self-pay

## 2021-03-08 VITALS — BP 110/80 | HR 66 | Ht 62.5 in | Wt 189.0 lb

## 2021-03-08 DIAGNOSIS — I10 Essential (primary) hypertension: Secondary | ICD-10-CM

## 2021-03-08 DIAGNOSIS — R002 Palpitations: Secondary | ICD-10-CM | POA: Diagnosis not present

## 2021-03-08 DIAGNOSIS — E782 Mixed hyperlipidemia: Secondary | ICD-10-CM | POA: Diagnosis not present

## 2021-03-08 MED ORDER — PROPRANOLOL HCL ER 80 MG PO CP24: 80.0000 mg | ORAL_CAPSULE | Freq: Every day | ORAL | 3 refills | Status: DC

## 2021-03-08 NOTE — Patient Instructions (Signed)
Medication Instructions:  INCREASE propranolol to 80mg  daily  *If you need a refill on your cardiac medications before your next appointment, please call your pharmacy*   Follow-Up: At [facility], you and your health needs are our priority.  As part of our continuing mission to provide you with exceptional heart care, we have created designated Provider Care Teams.  These Care Teams include your primary Cardiologist (physician) and Advanced Practice Providers (APPs -  Physician Assistants and Nurse Practitioners) who all work together to provide you with the care you need, when you need it.  We recommend signing up for the patient portal called "MyChart".  Sign up information is provided on this After Visit Summary.  MyChart is used to connect with patients for Virtual Visits (Telemedicine).  Patients are able to view lab/test results, encounter notes, upcoming appointments, etc.  Non-urgent messages can be sent to your provider as well.   To learn more about what you can do with MyChart, go to [email or web address].    Your next appointment:   12 month(s)  The format for your next appointment:   In Person  Provider:   You may see Dr. Debara Pickett or one of the following Advanced Practice Providers on your designated Care Team:   Almyra Deforest, PA-C Fabian Sharp, [place] or  Roby Lofts, [place]   Other Instructions

## 2021-03-08 NOTE — Progress Notes (Signed)
OFFICE NOTE  Chief Complaint:  Palpitations  Primary Care Physician: Kathyrn Lass, MD  HPI:  Susan Valencia is a 65 yo female with a history of palpitations, elevated BP, migraine headaches and soft flow murmur. After our first visit, I elected to start her on low dose propranolol XR 60 mg daily. She reported a marked improvement in her palpitations and almost a complete disappearance of her migraine headaches.  Since that time, she was getting some residual palpitations, only in the late evening. She then switched her medication to QHS dosing and noted an improvement. Today she is pretty much without complaints.  Susan Valencia returns today for follow-up. Overall she seems to be doing very well. She reports a propranolol works well for her palpitations and affect his done a good job of keeping her from having migraine headaches. She's had no side effects from Lipitor and is due for repeat testing. She denies any chest pain or worsening shortness of breath. Her only main concerns now are hot flashes and symptoms of being perimenopausal.  04/06/2016  Susan Valencia was seen today in follow-up. Overall she feels fairly well. She is still having some intermittent hot flashes. She says that her propranolol generally works for her palpitations although the other day she had palpitations mostly through one night. Her heart rate got up into the low 80s according to her fit but watch. She denies any fatigue with these episodes. Recently blood pressure was noted to be increased. Her primary care provider put her on low-dose losartan. This seems to be working for her. She is due for repeat testing of her lipid profile which was well controlled year ago. Weight is up about 11 pounds since her last office visit. She was recently found to be mildly hypothyroid and is on low-dose levothyroxine.  03/08/2017  Susan Valencia returns today for follow-up.  She reports some occasional palpitations that are breaking through  her propranolol.  She has well-controlled TSH and is on alternating levothyroxine 50/25 mcg every other day.  She also takes atorvastatin and losartan.  Blood pressures well controlled today at 130/82.  Thyroid and cholesterol followed by her primary care provider and were performed recently.  10/03/2017  Susan Valencia returns today for follow-up.  She again gets some palpitations, mostly in the evening and in the middle the night.  She recently saw her PCP who referred her for a sleep study however her insurance would not cover her study without a home study.  That is scheduled next week.  She denies any chest pain or worsening shortness of breath.  Blood pressure is reasonably well controlled.  Thyroid function is been normal.  01/07/2020  Susan Valencia returns today for follow-up. Overall she reports good control of her blood pressure which has been about the 120s. Today was elevated somewhat. She has had palpitations but is generally at night when she lays on the left side. She feels like her control is pretty good with propranolol. She is also on some losartan. She struggled with sleep apnea and had 16 AHI during her sleep study but cannot tolerate CPAP. She is considering an oral appliance.  03/08/2021  Susan Valencia returns today for follow-up.  Overall she is doing well although she has been having a little more palpitations mostly at night and when she lays on her left side.  Although this has been well controlled in the past, she said recently she has been having more.  Blood pressure is good today  110/80.  She was only on low-dose propranolol.  She reports pretty good control of her migraine headaches as well.  EKG shows normal sinus rhythm with some nonspecific T wave changes  PMHx:  Past Medical History:  Diagnosis Date   ASCUS (atypical squamous cells of undetermined significance) on Pap smear 2009   Daytime sleepiness    Dyslipidemia    Fibroid 2009   Hydrosalpinx 2009   Hyperlipidemia     Hypertension    Hypothyroidism    Menorrhagia 2010   Migraine    with menses   Osteopenia 2006   Osteoporosis 2006   Palpitations    Pelvic mass    Snoring    Systolic murmur 8185   Tachycardia 2013    Past Surgical History:  Procedure Laterality Date   CESAREAN SECTION     hysteroscopic resection     TRANSTHORACIC ECHOCARDIOGRAM  07/22/2011   EF >55%, normal    FAMHx:  Family History  Problem Relation Age of Onset   Cancer Mother        premenopausal breast    Anuerysm Mother        abdominal and cerebral   Cancer Sister        premenopausal breast   Hypertension Father    Heart disease Father 72   Anuerysm Sister     SOCHx:   reports that she has never smoked. She has never used smokeless tobacco. She reports current alcohol use. She reports that she does not use drugs.  ALLERGIES:  Allergies  Allergen Reactions   Multihance [Gadobenate Dimeglumine] Nausea And Vomiting    Pt has nausea and vomiting after injection of multihance for mri.    ROS: Pertinent items noted in HPI and remainder of comprehensive ROS otherwise negative.  HOME MEDS: Current Outpatient Medications  Medication Sig Dispense Refill   alendronate (FOSAMAX) 70 MG tablet Take 70 mg by mouth every 7 (seven) days. Take with a full glass of water on an empty stomach.     atorvastatin (LIPITOR) 40 MG tablet TAKE 1 TABLET(40 MG) BY MOUTH DAILY 30 tablet 11   Calcium Carbonate-Vitamin D (CALCIUM 500 + D PO) Take 2 tablets by mouth daily.     EVENING PRIMROSE OIL PO Take by mouth daily.     IBUPROFEN PO Take by mouth as needed.     levothyroxine (SYNTHROID) 50 MCG tablet TAKE 1 TABLET(50 MCG) BY MOUTH DAILY 90 tablet 1   losartan (COZAAR) 50 MG tablet TAKE 1 TABLET(50 MG) BY MOUTH DAILY 30 tablet 11   MAGNESIUM PO Take 400 mg by mouth daily.      Melatonin 2.5 MG CAPS Take 1 capsule by mouth at bedtime as needed. Take 1 tablet by mouth once daily at bedtime as needed for sleep.     Multiple  Vitamin (MULTIVITAMIN) tablet Take 1 tablet by mouth daily.     propranolol ER (INDERAL LA) 60 MG 24 hr capsule TAKE 1 CAPSULE(60 MG) BY MOUTH DAILY 30 capsule 0   No current facility-administered medications for this visit.    LABS/IMAGING: No results found for this or any previous visit (from the past 48 hour(s)). No results found.  VITALS: BP 110/80 (BP Location: Left Arm, Patient Position: Sitting, Cuff Size: Large)   Pulse 66   Ht 5' 2.5" (1.588 m)   Wt 189 lb (85.7 kg)   BMI 34.02 kg/m   EXAM: General appearance: alert and no distress Neck: no carotid bruit, no JVD and thyroid  not enlarged, symmetric, no tenderness/mass/nodules Lungs: clear to auscultation bilaterally Heart: regular rate and rhythm, S1, S2 normal, no murmur, click, rub or gallop Abdomen: soft, non-tender; bowel sounds normal; no masses,  no organomegaly Extremities: extremities normal, atraumatic, no cyanosis or edema Pulses: 2+ and symmetric Skin: Skin color, texture, turgor normal. No rashes or lesions Neurologic: Grossly normal Psych: Pleasant  EKG: Normal sinus rhythm at 66, nonspecific T wave changes-personally reviewed  ASSESSMENT: Palpitations - mostly controlled on propranolol Migraine headaches - improved with b-blocker Dyslipidemia-stable on Lipitor Essential hypertension Hypothyroidism OSA not on CPAP  PLAN: 1.   Susan Valencia has had a little uptake in her palpitations, mostly at night.  Blood pressure appears well controlled.  She is only on low-dose of the propranolol.  We will increase it slightly from 60 to 80 mg daily at night to see if this gives her some more benefit.  Otherwise she is doing well with good blood pressure and cholesterol control.  Plan follow-up with me annually or sooner as necessary  Pixie Casino, MD, [facility], [place] Director of the [facility] Diplomate of the American  Board of Clinical Lipidology Attending Cardiologist  Direct Dial: [phone]  Fax: [phone]  Website:  [email or web address]  03/08/2021, 11:11 AM

## 2021-03-29 ENCOUNTER — Other Ambulatory Visit: Payer: Self-pay | Admitting: Internal Medicine

## 2021-04-09 DIAGNOSIS — M8589 Other specified disorders of bone density and structure, multiple sites: Secondary | ICD-10-CM | POA: Diagnosis not present

## 2021-05-18 DIAGNOSIS — Z124 Encounter for screening for malignant neoplasm of cervix: Secondary | ICD-10-CM | POA: Diagnosis not present

## 2021-05-18 DIAGNOSIS — Z1231 Encounter for screening mammogram for malignant neoplasm of breast: Secondary | ICD-10-CM | POA: Diagnosis not present

## 2021-05-18 DIAGNOSIS — Z6833 Body mass index (BMI) 33.0-33.9, adult: Secondary | ICD-10-CM | POA: Diagnosis not present

## 2021-05-18 DIAGNOSIS — M810 Age-related osteoporosis without current pathological fracture: Secondary | ICD-10-CM | POA: Diagnosis not present

## 2021-05-18 DIAGNOSIS — Z1211 Encounter for screening for malignant neoplasm of colon: Secondary | ICD-10-CM | POA: Diagnosis not present

## 2021-05-18 DIAGNOSIS — Z01419 Encounter for gynecological examination (general) (routine) without abnormal findings: Secondary | ICD-10-CM | POA: Diagnosis not present

## 2021-05-18 DIAGNOSIS — R32 Unspecified urinary incontinence: Secondary | ICD-10-CM | POA: Diagnosis not present

## 2021-05-31 DIAGNOSIS — E669 Obesity, unspecified: Secondary | ICD-10-CM | POA: Diagnosis not present

## 2021-05-31 DIAGNOSIS — Z1389 Encounter for screening for other disorder: Secondary | ICD-10-CM | POA: Diagnosis not present

## 2021-05-31 DIAGNOSIS — Z23 Encounter for immunization: Secondary | ICD-10-CM | POA: Diagnosis not present

## 2021-05-31 DIAGNOSIS — I10 Essential (primary) hypertension: Secondary | ICD-10-CM | POA: Diagnosis not present

## 2021-05-31 DIAGNOSIS — Z0000 Encounter for general adult medical examination without abnormal findings: Secondary | ICD-10-CM | POA: Diagnosis not present

## 2021-05-31 DIAGNOSIS — E7800 Pure hypercholesterolemia, unspecified: Secondary | ICD-10-CM | POA: Diagnosis not present

## 2021-05-31 DIAGNOSIS — E039 Hypothyroidism, unspecified: Secondary | ICD-10-CM | POA: Diagnosis not present

## 2021-05-31 DIAGNOSIS — M8580 Other specified disorders of bone density and structure, unspecified site: Secondary | ICD-10-CM | POA: Diagnosis not present

## 2021-07-08 DIAGNOSIS — M810 Age-related osteoporosis without current pathological fracture: Secondary | ICD-10-CM | POA: Diagnosis not present

## 2021-07-26 ENCOUNTER — Other Ambulatory Visit: Payer: Self-pay | Admitting: Endocrinology

## 2021-08-04 DIAGNOSIS — R399 Unspecified symptoms and signs involving the genitourinary system: Secondary | ICD-10-CM | POA: Diagnosis not present

## 2021-11-02 ENCOUNTER — Other Ambulatory Visit (INDEPENDENT_AMBULATORY_CARE_PROVIDER_SITE_OTHER): Payer: PPO

## 2021-11-02 DIAGNOSIS — E063 Autoimmune thyroiditis: Secondary | ICD-10-CM | POA: Diagnosis not present

## 2021-11-02 LAB — TSH: TSH: 6.37 u[IU]/mL — ABNORMAL HIGH (ref 0.35–5.50)

## 2021-11-02 LAB — T4, FREE: Free T4: 1.12 ng/dL (ref 0.60–1.60)

## 2021-11-04 ENCOUNTER — Encounter: Payer: Self-pay | Admitting: Endocrinology

## 2021-11-04 ENCOUNTER — Ambulatory Visit: Payer: PPO | Admitting: Endocrinology

## 2021-11-04 VITALS — BP 122/78 | HR 61 | Ht 62.5 in | Wt 192.4 lb

## 2021-11-04 DIAGNOSIS — E063 Autoimmune thyroiditis: Secondary | ICD-10-CM | POA: Diagnosis not present

## 2021-11-04 MED ORDER — LEVOTHYROXINE SODIUM 125 MCG PO TABS: 62.5000 ug | ORAL_TABLET | Freq: Every day | ORAL | 3 refills | Status: DC

## 2021-11-04 NOTE — Progress Notes (Signed)
Patient ID: Susan Valencia, female   DOB: 10-16-1955, 66 y.o.   MRN: [id number]            Referring Provider: Kendall Flack  Reason for Appointment:  Hypothyroidism, follow-up visit    History of Present Illness:   Hypothyroidism was first diagnosed in 2017  At the time of diagnosis patient was not having any symptoms of  fatigue, cold sensitivity, difficulty concentrating, dry skin or hair loss .      She was having some difficulty losing weight as before She thinks her labs were tested because of family history of thyroid disease as well as routine annual visits       The patient has been treated with  either 25 or 50 mcg of levothyroxine since 2017 She thinks that her initial dose may have been excessive and she was told to cut back With starting thyroid supplementation she did not feel any different and also had not felt any different with adjusting her dosage up or down  RECENT history:  Since it was unclear whether she has subclinical hypothyroidism or not she was told to stop her 50 mcg levothyroxine on her initial consultation in October 2020 She did not feel any different with stopping her supplement but her TSH was back up to 10.3 as of 04/2019  She is on levothyroxine 50 mcg daily since 12/20 Did not feel any different with starting the supplement  She takes her levothyroxine daily before breakfast She does feel fairly good but she thinks she has had a little bit more sleepiness recently but for the last month has had some insomnia also, taking melatonin Weight is about the same  TSH is higher than usual at 6.4         Patient's weight history is as follows:  Wt Readings from Last 3 Encounters:  11/04/21 192 lb 6.4 oz (87.3 kg)  03/08/21 189 lb (85.7 kg)  11/05/20 190 lb (86.2 kg)    Thyroid function results have been as follows:   Lab Results  Component Value Date   TSH 6.37 (H) 11/02/2021   TSH 1.88 11/03/2020   TSH 3.23 10/28/2019   TSH 10.30 (H)  04/29/2019   FREET4 1.12 11/02/2021   FREET4 0.97 11/03/2020   FREET4 0.99 10/28/2019   FREET4 0.87 04/29/2019    On her initial visit she was felt to have a possible left thyroid nodule on exam  THYROID ultrasound in 03/2019: Showed the following  Moderately heterogeneous thyroid gland without evidence for distinct thyroid nodule.   Past Medical History:  Diagnosis Date   ASCUS (atypical squamous cells of undetermined significance) on Pap smear 2009   Daytime sleepiness    Dyslipidemia    Fibroid 2009   Hydrosalpinx 2009   Hyperlipidemia    Hypertension    Hypothyroidism    Menorrhagia 2010   Migraine    with menses   Osteopenia 2006   Osteoporosis 2006   Palpitations    Pelvic mass    Snoring    Systolic murmur 2440   Tachycardia 2013    Past Surgical History:  Procedure Laterality Date   CESAREAN SECTION     hysteroscopic resection     TRANSTHORACIC ECHOCARDIOGRAM  07/22/2011   EF >55%, normal    Family History  Problem Relation Age of Onset   Cancer Mother        premenopausal breast    Anuerysm Mother        abdominal and cerebral  Cancer Sister        premenopausal breast   Hypertension Father    Heart disease Father 22   Anuerysm Sister     Social History:  reports that she has never smoked. She has never used smokeless tobacco. She reports current alcohol use. She reports that she does not use drugs.  Allergies:  Allergies  Allergen Reactions   Multihance [Gadobenate Dimeglumine] Nausea And Vomiting    Pt has nausea and vomiting after injection of multihance for mri.    Allergies as of 11/04/2021       Reactions   Multihance [gadobenate Dimeglumine] Nausea And Vomiting   Pt has nausea and vomiting after injection of multihance for mri.        Medication List        Accurate as of November 04, 2021 10:08 AM. If you have any questions, ask your nurse or doctor.          alendronate 70 MG tablet Commonly known as: FOSAMAX Take 70  mg by mouth every 7 (seven) days. Take with a full glass of water on an empty stomach.   atorvastatin 40 MG tablet Commonly known as: LIPITOR TAKE 1 TABLET(40 MG) BY MOUTH DAILY   CALCIUM 500 + D PO Take 2 tablets by mouth daily.   EVENING PRIMROSE OIL PO Take by mouth daily.   IBUPROFEN PO Take by mouth as needed.   levothyroxine 50 MCG tablet Commonly known as: SYNTHROID TAKE 1 TABLET(50 MCG) BY MOUTH DAILY   losartan 50 MG tablet Commonly known as: COZAAR TAKE 1 TABLET(50 MG) BY MOUTH DAILY   MAGNESIUM PO Take 400 mg by mouth daily.   Melatonin 2.5 MG Caps Take 1 capsule by mouth at bedtime as needed. Take 1 tablet by mouth once daily at bedtime as needed for sleep.   multivitamin tablet Take 1 tablet by mouth daily.   Prolia 60 MG/ML Sosy injection Generic drug: denosumab INJECT 1 ML UNDER THE SKIN EVERY 6 MONTHS   propranolol ER 80 MG 24 hr capsule Commonly known as: INDERAL LA Take 1 capsule (80 mg total) by mouth at bedtime.           Review of Systems   She still gets occasional palpitations and is still having some palpitations at night despite taking propranolol  History of OSTEOPOROSIS followed by gynecologist and now switched to Prolia instead of Fosamax Bone density information not available   Examination:    BP 122/78   Pulse 61   Ht 5' 2.5" (1.588 m)   Wt 192 lb 6.4 oz (87.3 kg)   SpO2 99%   BMI 34.63 kg/m   Thyroid not palpable Biceps reflexes appear to be normal No peripheral edema  Assessment:  HYPOTHYROIDISM, mild without a goiter   She has not had any symptoms even with her TSH at baseline about 10 Does most likely have Hashimoto's thyroiditis as seen on her ultrasound of the thyroid  Because of her TSH being 10+ she has been on supplements although subjectively has not felt any different with levothyroxine Her recent feeling of somnolence is likely to be from insomnia TSH is going up to 6.4 indicating some progression of  her hypothyroidism   No goiter on exam, previously had heterogenous appearance of thyroid on ultrasound   PLAN:  Increase dose to 125 mcg, half tablet daily and follow-up in 3 months again Reminded her to take this before breakfast and without any calcium or iron at the  same time  Elayne Snare 11/04/2021, 10:08 AM     Note: This office note was prepared with Dragon voice recognition system technology. Any transcriptional errors that result from this process are unintentional.

## 2021-12-14 DIAGNOSIS — Z1231 Encounter for screening mammogram for malignant neoplasm of breast: Secondary | ICD-10-CM | POA: Diagnosis not present

## 2021-12-24 ENCOUNTER — Other Ambulatory Visit: Payer: Self-pay | Admitting: Internal Medicine

## 2022-01-11 DIAGNOSIS — M810 Age-related osteoporosis without current pathological fracture: Secondary | ICD-10-CM | POA: Diagnosis not present

## 2022-01-12 DIAGNOSIS — H40013 Open angle with borderline findings, low risk, bilateral: Secondary | ICD-10-CM | POA: Diagnosis not present

## 2022-02-01 ENCOUNTER — Other Ambulatory Visit (INDEPENDENT_AMBULATORY_CARE_PROVIDER_SITE_OTHER): Payer: PPO

## 2022-02-01 DIAGNOSIS — E063 Autoimmune thyroiditis: Secondary | ICD-10-CM

## 2022-02-01 LAB — T4, FREE: Free T4: 1.06 ng/dL (ref 0.60–1.60)

## 2022-02-01 LAB — TSH: TSH: 3.88 u[IU]/mL (ref 0.35–5.50)

## 2022-02-03 ENCOUNTER — Ambulatory Visit: Payer: PPO | Admitting: Endocrinology

## 2022-02-03 ENCOUNTER — Encounter: Payer: Self-pay | Admitting: Endocrinology

## 2022-02-03 VITALS — BP 130/82 | HR 61 | Wt 192.2 lb

## 2022-02-03 DIAGNOSIS — E063 Autoimmune thyroiditis: Secondary | ICD-10-CM | POA: Diagnosis not present

## 2022-02-03 NOTE — Progress Notes (Signed)
Patient ID: Susan Valencia, female   DOB: 12/22/1955, 66 y.o.   MRN: [id number]             Reason for Appointment:  Hypothyroidism, follow-up visit    History of Present Illness:   Hypothyroidism was first diagnosed in 2017  At the time of diagnosis patient was not having any symptoms of  fatigue, cold sensitivity, difficulty concentrating, dry skin or hair loss .      She was having some difficulty losing weight as before She thinks her labs were tested because of family history of thyroid disease as well as routine annual visits       The patient has been treated with  either 25 or 50 mcg of levothyroxine since 2017 She thinks that her initial dose may have been excessive and she was told to cut back With starting thyroid supplementation she did not feel any different and also had not felt any different with adjusting her dosage up or down  RECENT history:  Since it was unclear whether she has subclinical hypothyroidism or not she was told to stop her 50 mcg levothyroxine on her initial consultation in October 2020 She did not feel any different with stopping her supplement but her TSH was back up to 10.3 as of 04/2019  She was on levothyroxine 50 mcg daily since 12/20 Did not feel any different with starting the levothyroxine supplement  She takes her levothyroxine daily about 1 hour before breakfast She does feel fairly good Last visit she had a complaint of more sleepiness along with some insomnia taking melatonin This is slightly better now and usually resolves within a afternoon nap  Otherwise has fairly good energy level Weight is about the same as before  She is now taking 125 mcg, half tablet daily  TSH is back to normal, previously was at 6.4         Patient's weight history is as follows:  Wt Readings from Last 3 Encounters:  02/03/22 192 lb 3.2 oz (87.2 kg)  11/04/21 192 lb 6.4 oz (87.3 kg)  03/08/21 189 lb (85.7 kg)    Thyroid function results have been as  follows:   Lab Results  Component Value Date   TSH 3.88 02/01/2022   TSH 6.37 (H) 11/02/2021   TSH 1.88 11/03/2020   TSH 3.23 10/28/2019   FREET4 1.06 02/01/2022   FREET4 1.12 11/02/2021   FREET4 0.97 11/03/2020   FREET4 0.99 10/28/2019    On her initial visit she was felt to have a possible left thyroid nodule on exam  THYROID ultrasound in 03/2019: Showed the following  Moderately heterogeneous thyroid gland without evidence for distinct thyroid nodule.   Past Medical History:  Diagnosis Date   ASCUS (atypical squamous cells of undetermined significance) on Pap smear 2009   Daytime sleepiness    Dyslipidemia    Fibroid 2009   Hydrosalpinx 2009   Hyperlipidemia    Hypertension    Hypothyroidism    Menorrhagia 2010   Migraine    with menses   Osteopenia 2006   Osteoporosis 2006   Palpitations    Pelvic mass    Snoring    Systolic murmur 7209   Tachycardia 2013    Past Surgical History:  Procedure Laterality Date   CESAREAN SECTION     hysteroscopic resection     TRANSTHORACIC ECHOCARDIOGRAM  07/22/2011   EF >55%, normal    Family History  Problem Relation Age of Onset   Cancer  Mother        premenopausal breast    Anuerysm Mother        abdominal and cerebral   Cancer Sister        premenopausal breast   Hypertension Father    Heart disease Father 26   Anuerysm Sister     Social History:  reports that she has never smoked. She has never used smokeless tobacco. She reports current alcohol use. She reports that she does not use drugs.  Allergies:  Allergies  Allergen Reactions   Multihance [Gadobenate Dimeglumine] Nausea And Vomiting    Pt has nausea and vomiting after injection of multihance for mri.    Allergies as of 02/03/2022       Reactions   Multihance [gadobenate Dimeglumine] Nausea And Vomiting   Pt has nausea and vomiting after injection of multihance for mri.        Medication List        Accurate as of February 03, 2022  10:41 AM. If you have any questions, ask your nurse or doctor.          atorvastatin 40 MG tablet Commonly known as: LIPITOR Take 1 tablet (40 mg total) by mouth daily. SCHEDULE OFFICE VISIT FOR FUTURE REFILLS.   CALCIUM 500 + D PO Take 2 tablets by mouth daily.   EVENING PRIMROSE OIL PO Take by mouth daily.   IBUPROFEN PO Take by mouth as needed.   levothyroxine 125 MCG tablet Commonly known as: SYNTHROID Take 0.5 tablets (62.5 mcg total) by mouth daily.   losartan 50 MG tablet Commonly known as: COZAAR Take 1 tablet (50 mg total) by mouth daily. SCHEDULE OFFICE VISIT FOR FUTURE REFILLS.   MAGNESIUM PO Take 400 mg by mouth daily.   Melatonin 2.5 MG Caps Take 1 capsule by mouth at bedtime as needed. Take 1 tablet by mouth once daily at bedtime as needed for sleep.   multivitamin tablet Take 1 tablet by mouth daily.   Prolia 60 MG/ML Sosy injection Generic drug: denosumab INJECT 1 ML UNDER THE SKIN EVERY 6 MONTHS   propranolol ER 80 MG 24 hr capsule Commonly known as: INDERAL LA Take 1 capsule (80 mg total) by mouth at bedtime.           Review of Systems   She still gets occasional palpitations and is still having some palpitations at night despite taking propranolol  History of OSTEOPOROSIS followed by gynecologist and now switched to Prolia instead of Fosamax Bone density information not available   Examination:    BP 130/82   Pulse 61   Wt 192 lb 3.2 oz (87.2 kg)   SpO2 93%   BMI 34.59 kg/m     Assessment:  HYPOTHYROIDISM, mild without a goiter   She has not had any symptoms even with her TSH at baseline about 10 Does most likely have Hashimoto's thyroiditis as seen on her ultrasound of the thyroid Also has significant family history  As expected she did not feel any different with increasing her dose on the last visit when TSH was 6.4  TSH is back to normal with increasing her dose to 125 mcg, half tablet daily  Has no history of  goiter recently, previously had heterogenous appearance of thyroid on ultrasound   PLAN:   Continue 125 mcg, half tablet daily and follow-up in 6 months She can take her supplement 30 minutes before eating in the morning  Elayne Snare 02/03/2022, 10:41 AM  Note: This office note was prepared with Estate agent. Any transcriptional errors that result from this process are unintentional.

## 2022-02-24 ENCOUNTER — Other Ambulatory Visit: Payer: Self-pay | Admitting: Internal Medicine

## 2022-03-02 ENCOUNTER — Other Ambulatory Visit: Payer: Self-pay | Admitting: Internal Medicine

## 2022-03-03 ENCOUNTER — Telehealth: Payer: Self-pay | Admitting: Internal Medicine

## 2022-03-03 MED ORDER — PROPRANOLOL HCL ER 80 MG PO CP24: 80.0000 mg | ORAL_CAPSULE | Freq: Every day | ORAL | 1 refills | Status: DC

## 2022-03-03 NOTE — Telephone Encounter (Signed)
*  STAT* If patient is at the pharmacy, call can be transferred to refill team.   1. Which medications need to be refilled? (please list name of each medication and dose if known) propranolol ER (INDERAL LA) 80 MG 24 hr capsule  2. Which pharmacy/location (including street and city if local pharmacy) is medication to be sent to? [facility] [place], [place] AT [address]  3. Do they need a 30 day or 90 day supply? 90   Patient appt is 06/03/22

## 2022-03-15 ENCOUNTER — Other Ambulatory Visit (INDEPENDENT_AMBULATORY_CARE_PROVIDER_SITE_OTHER): Payer: PPO

## 2022-03-15 ENCOUNTER — Ambulatory Visit (HOSPITAL_BASED_OUTPATIENT_CLINIC_OR_DEPARTMENT_OTHER): Payer: PPO | Admitting: Internal Medicine

## 2022-03-15 ENCOUNTER — Other Ambulatory Visit: Payer: Self-pay

## 2022-03-15 ENCOUNTER — Encounter (HOSPITAL_BASED_OUTPATIENT_CLINIC_OR_DEPARTMENT_OTHER): Payer: Self-pay | Admitting: Internal Medicine

## 2022-03-15 VITALS — BP 133/81 | HR 64 | Ht 62.5 in | Wt 193.3 lb

## 2022-03-15 DIAGNOSIS — R002 Palpitations: Secondary | ICD-10-CM

## 2022-03-15 DIAGNOSIS — I10 Essential (primary) hypertension: Secondary | ICD-10-CM | POA: Diagnosis not present

## 2022-03-15 NOTE — Progress Notes (Signed)
OFFICE NOTE  Chief Complaint:  Follow-up palpitations  Primary Care Physician: Kathyrn Lass, MD  HPI:  Susan Valencia is a 66 yo female with a history of palpitations, elevated BP, migraine headaches and soft flow murmur. After our first visit, I elected to start her on low dose propranolol XR 60 mg daily. She reported a marked improvement in her palpitations and almost a complete disappearance of her migraine headaches.  Since that time, she was getting some residual palpitations, only in the late evening. She then switched her medication to QHS dosing and noted an improvement. Today she is pretty much without complaints.  Susan Valencia returns today for follow-up. Overall she seems to be doing very well. She reports a propranolol works well for her palpitations and affect his done a good job of keeping her from having migraine headaches. She's had no side effects from Lipitor and is due for repeat testing. She denies any chest pain or worsening shortness of breath. Her only main concerns now are hot flashes and symptoms of being perimenopausal.  04/06/2016  Susan Valencia was seen today in follow-up. Overall she feels fairly well. She is still having some intermittent hot flashes. She says that her propranolol generally works for her palpitations although the other day she had palpitations mostly through one night. Her heart rate got up into the low 80s according to her fit but watch. She denies any fatigue with these episodes. Recently blood pressure was noted to be increased. Her primary care provider put her on low-dose losartan. This seems to be working for her. She is due for repeat testing of her lipid profile which was well controlled year ago. Weight is up about 11 pounds since her last office visit. She was recently found to be mildly hypothyroid and is on low-dose levothyroxine.  03/08/2017  Susan Valencia returns today for follow-up.  She reports some occasional palpitations that are  breaking through her propranolol.  She has well-controlled TSH and is on alternating levothyroxine 50/25 mcg every other day.  She also takes atorvastatin and losartan.  Blood pressures well controlled today at 130/82.  Thyroid and cholesterol followed by her primary care provider and were performed recently.  10/03/2017  Susan Valencia returns today for follow-up.  She again gets some palpitations, mostly in the evening and in the middle the night.  She recently saw her PCP who referred her for a sleep study however her insurance would not cover her study without a home study.  That is scheduled next week.  She denies any chest pain or worsening shortness of breath.  Blood pressure is reasonably well controlled.  Thyroid function is been normal.  01/07/2020  Susan Valencia returns today for follow-up. Overall she reports good control of her blood pressure which has been about the 120s. Today was elevated somewhat. She has had palpitations but is generally at night when she lays on the left side. She feels like her control is pretty good with propranolol. She is also on some losartan. She struggled with sleep apnea and had 16 AHI during her sleep study but cannot tolerate CPAP. She is considering an oral appliance.  03/08/2021  Susan Valencia returns today for follow-up.  Overall she is doing well although she has been having a little more palpitations mostly at night and when she lays on her left side.  Although this has been well controlled in the past, she said recently she has been having more.  Blood pressure is  good today 110/80.  She was only on low-dose propranolol.  She reports pretty good control of her migraine headaches as well.  EKG shows normal sinus rhythm with some nonspecific T wave changes  03/15/2022  Susan Valencia returns today for follow-up.  She is having persistent palpitations.  This seems to be much worse at night.  She is also been having issues with hot flashes and had one today in the office.   EKG shows a normal sinus rhythm without any ectopic beats.  He had worn a Holter monitor back in 2018 which showed sinus rhythms and occasional PACs and PVCs.  I suspect she is having more of these but we will go ahead and place a 2-week monitor to see if we can better conceptualize this.  I had previously increased her propranolol from 60 to 80 mg that she notes no improvement with that.  PMHx:  Past Medical History:  Diagnosis Date   ASCUS (atypical squamous cells of undetermined significance) on Pap smear 2009   Daytime sleepiness    Dyslipidemia    Fibroid 2009   Hydrosalpinx 2009   Hyperlipidemia    Hypertension    Hypothyroidism    Menorrhagia 2010   Migraine    with menses   Osteopenia 2006   Osteoporosis 2006   Palpitations    Pelvic mass    Snoring    Systolic murmur 6270   Tachycardia 2013    Past Surgical History:  Procedure Laterality Date   CESAREAN SECTION     hysteroscopic resection     TRANSTHORACIC ECHOCARDIOGRAM  07/22/2011   EF >55%, normal    FAMHx:  Family History  Problem Relation Age of Onset   Cancer Mother        premenopausal breast    Anuerysm Mother        abdominal and cerebral   Hypertension Father    Heart disease Father 53   Thyroid disease Sister    Cancer Sister        premenopausal breast   Thyroid disease Sister    Anuerysm Sister    Thyroid disease Brother     SOCHx:   reports that she has never smoked. She has never used smokeless tobacco. She reports current alcohol use. She reports that she does not use drugs.  ALLERGIES:  Allergies  Allergen Reactions   Multihance [Gadobenate Dimeglumine] Nausea And Vomiting    Pt has nausea and vomiting after injection of multihance for mri.    ROS: Pertinent items noted in HPI and remainder of comprehensive ROS otherwise negative.  HOME MEDS: Current Outpatient Medications  Medication Sig Dispense Refill   atorvastatin (LIPITOR) 40 MG tablet TAKE 1 TABLET BY MOUTH DAILY 90  tablet 0   Calcium Carbonate-Vitamin D (CALCIUM 500 + D PO) Take 2 tablets by mouth daily.     denosumab (PROLIA) 60 MG/ML SOSY injection INJECT 1 ML UNDER THE SKIN EVERY 6 MONTHS     EVENING PRIMROSE OIL PO Take by mouth daily.     IBUPROFEN PO Take by mouth as needed.     levothyroxine (SYNTHROID) 125 MCG tablet Take 0.5 tablets (62.5 mcg total) by mouth daily. 45 tablet 3   losartan (COZAAR) 50 MG tablet TAKE 1 TABLET BY MOUTH EVERY DAY 90 tablet 0   MAGNESIUM PO Take 400 mg by mouth daily.      Melatonin 2.5 MG CAPS Take 1 capsule by mouth at bedtime as needed. Take 1 tablet by mouth once  daily at bedtime as needed for sleep.     Multiple Vitamin (MULTIVITAMIN) tablet Take 1 tablet by mouth daily.     propranolol ER (INDERAL LA) 80 MG 24 hr capsule Take 1 capsule (80 mg total) by mouth at bedtime. Please contact the office to schedule appointment for additional refills. 1st Attempt. 90 capsule 1   No current facility-administered medications for this visit.    LABS/IMAGING: No results found for this or any previous visit (from the past 48 hour(s)). No results found.  VITALS: BP 133/81 (BP Location: Left Arm, Patient Position: Sitting, Cuff Size: Large)   Pulse 64   Ht 5' 2.5" (1.588 m)   Wt 193 lb 4.8 oz (87.7 kg)   SpO2 99%   BMI 34.79 kg/m   EXAM: General appearance: alert and no distress Neck: no carotid bruit, no JVD and thyroid not enlarged, symmetric, no tenderness/mass/nodules Lungs: clear to auscultation bilaterally Heart: regular rate and rhythm, S1, S2 normal, no murmur, click, rub or gallop Abdomen: soft, non-tender; bowel sounds normal; no masses,  no organomegaly Extremities: extremities normal, atraumatic, no cyanosis or edema Pulses: 2+ and symmetric Skin: Skin color, texture, turgor normal. No rashes or lesions Neurologic: Grossly normal Psych: Pleasant  EKG: Normal sinus rhythm at 64-personally reviewed  ASSESSMENT: Palpitations Migraine headaches -  improved with b-blocker Dyslipidemia-stable on Lipitor Essential hypertension Hypothyroidism OSA not on CPAP  PLAN: 1.   Mrs. Kurek reports she has had some worsening palpitations, despite an increase in her propranolol.  She had previously been noted to have PACs and PVCs on the monitor back in 2018 and will place another monitor for 2 weeks.  We may need to consider switching her beta-blocker to a more cardioselective beta-blocker such as metoprolol or bisoprolol.  She is concerned about recent weight gain and whether this might be related to the beta-blocker.  If she has frequent PVCs then we might consider therapy with a 1C antiarrhythmic agent.  Further management based on her monitor findings.  Follow-up with me in 2 to 3 months.  Pixie Casino, MD, [facility], [place] Director of the Advanced Lipid Disorders &  Cardiovascular Risk Reduction Clinic Diplomate of the American Board of Clinical Lipidology Attending Cardiologist  Direct Dial: [phone]  Fax: [phone]  Website:  [email or web address]  03/15/2022, 2:24 PM

## 2022-03-15 NOTE — Patient Instructions (Signed)
Medication Instructions:  NO CHANGES today   *If you need a refill on your cardiac medications before your next appointment, please call your pharmacy*   Testing/Procedures: ZIO AT Long term monitor  Your physician has requested you wear a ZIO patch monitor for 14 days.  This is a single patch monitor. [employer] supplies one patch monitor per enrollment. Additional  stickers are not available.  Please do not apply patch if you will be having a Nuclear Stress Test, Echocardiogram, Cardiac CT, MRI,  or Chest Xray during the period you would be wearing the monitor. The patch cannot be worn during  these tests. You cannot remove and re-apply the ZIO AT patch monitor.  Your ZIO patch monitor will be mailed 3 day USPS to your address on file. It may take 3-5 days to  receive your monitor after you have been enrolled.  Once you have received your monitor, please review the enclosed instructions. Your monitor has  already been registered assigning a specific monitor serial # to you.   Billing and Patient Assistance Program information  Susan Valencia has been supplied with any insurance information on record for billing. [employer] offers a sliding scale Patient Assistance Program for patients without insurance, or whose  insurance does not completely cover the cost of the ZIO patch monitor. You must apply for the  Patient Assistance Program to qualify for the discounted rate. To apply, call [employer] at [phone],  select option 4, select option 2 , ask to apply for the Patient Assistance Program, (you can request an  interpreter if needed). [employer] will ask your household income and how many people are in your  household. [employer] will quote your out-of-pocket cost based on this information. They will also be able  to set up a 12 month interest free payment plan if needed.  Applying the monitor   Shave hair from upper left chest.  Hold the abrader disc by orange tab. Rub the abrader in 40 strokes  over left upper chest as indicated in  your monitor instructions.  Clean area with 4 enclosed alcohol pads. Use all pads to ensure the area is cleaned thoroughly. Let  dry.  Apply patch as indicated in monitor instructions. Patch will be placed under collarbone on left side of  chest with arrow pointing upward.  Rub patch adhesive wings for 2 minutes. Remove the white label marked "1". Remove the white label  marked "2". Rub patch adhesive wings for 2 additional minutes.  While looking in a mirror, press and release button in center of patch. A small green light will flash 3-4  times. This will be your only indicator that the monitor has been turned on.  Do not shower for the first 24 hours. You may shower after the first 24 hours.  Press the button if you feel a symptom. You will hear a small click. Record Date, Time and Symptom in  the Patient Log.   Starting the Gateway  In your kit there is a Hydrographic surveyor box the size of a cellphone. This is Airline pilot. It transmits all your  recorded data to [facility]. This box must always stay within 10 feet of you. Open the box and push the *  button. There will be a light that blinks orange and then green a few times. When the light stops  blinking, the Gateway is connected to the ZIO patch. Call [employer] at [phone] to confirm your monitor is transmitting.  Returning your monitor  Remove your patch  and place it inside the Gateway. In the lower half of the Gateway there is a white  bag with prepaid postage on it. Place Gateway in bag and seal. Mail package back to [employer] as soon as  possible. Your physician should have your final report approximately 7 days after you have mailed back  your monitor. Call [employer] Customer Care at [phone] if you have questions regarding your ZIO AT  patch monitor. Call them immediately if you see an orange light blinking on your monitor.  If your monitor falls off in less than 4 days,  contact our Monitor department at [phone]. If your  monitor becomes loose or falls off after 4 days call [employer] at [phone] for suggestions on  securing your monitor    Follow-Up: At [facility], you and your health needs are our priority.  As part of our continuing mission to provide you with exceptional heart care, we have created designated Provider Care Teams.  These Care Teams include your primary Cardiologist (physician) and Advanced Practice Providers (APPs -  Physician Assistants and Nurse Practitioners) who all work together to provide you with the care you need, when you need it.  We recommend signing up for the patient portal called "MyChart".  Sign up information is provided on this After Visit Summary.  MyChart is used to connect with patients for Virtual Visits (Telemedicine).  Patients are able to view lab/test results, encounter notes, upcoming appointments, etc.  Non-urgent messages can be sent to your provider as well.   To learn more about what you can do with MyChart, go to [email or web address].    Your next appointment:    2 months with Dr. Hilty -- OK for DOD day per   

## 2022-04-07 DIAGNOSIS — R002 Palpitations: Secondary | ICD-10-CM | POA: Diagnosis not present

## 2022-04-29 ENCOUNTER — Other Ambulatory Visit: Payer: Self-pay | Admitting: Internal Medicine

## 2022-05-18 DIAGNOSIS — N952 Postmenopausal atrophic vaginitis: Secondary | ICD-10-CM | POA: Diagnosis not present

## 2022-05-18 DIAGNOSIS — R32 Unspecified urinary incontinence: Secondary | ICD-10-CM | POA: Diagnosis not present

## 2022-05-18 DIAGNOSIS — Z1231 Encounter for screening mammogram for malignant neoplasm of breast: Secondary | ICD-10-CM | POA: Diagnosis not present

## 2022-05-18 DIAGNOSIS — R208 Other disturbances of skin sensation: Secondary | ICD-10-CM | POA: Diagnosis not present

## 2022-05-18 DIAGNOSIS — M810 Age-related osteoporosis without current pathological fracture: Secondary | ICD-10-CM | POA: Diagnosis not present

## 2022-05-18 DIAGNOSIS — Z01411 Encounter for gynecological examination (general) (routine) with abnormal findings: Secondary | ICD-10-CM | POA: Diagnosis not present

## 2022-05-18 DIAGNOSIS — Z1211 Encounter for screening for malignant neoplasm of colon: Secondary | ICD-10-CM | POA: Diagnosis not present

## 2022-05-30 ENCOUNTER — Ambulatory Visit: Payer: PPO | Attending: Internal Medicine | Admitting: Internal Medicine

## 2022-05-30 ENCOUNTER — Encounter: Payer: Self-pay | Admitting: Internal Medicine

## 2022-05-30 VITALS — BP 118/72 | HR 71 | Ht 63.0 in | Wt 192.2 lb

## 2022-05-30 DIAGNOSIS — R002 Palpitations: Secondary | ICD-10-CM

## 2022-05-30 DIAGNOSIS — I10 Essential (primary) hypertension: Secondary | ICD-10-CM | POA: Diagnosis not present

## 2022-05-30 DIAGNOSIS — E782 Mixed hyperlipidemia: Secondary | ICD-10-CM | POA: Diagnosis not present

## 2022-05-30 MED ORDER — METOPROLOL SUCCINATE ER 25 MG PO TB24: 25.0000 mg | ORAL_TABLET | Freq: Every day | ORAL | 3 refills | Status: DC

## 2022-05-30 NOTE — Progress Notes (Signed)
OFFICE NOTE  Chief Complaint:  Follow-up palpitations  Primary Care Physician: Kathyrn Lass, MD  HPI:  Susan Valencia is a 67 yo female with a history of palpitations, elevated BP, migraine headaches and soft flow murmur. After our first visit, I elected to start her on low dose propranolol XR 60 mg daily. She reported a marked improvement in her palpitations and almost a complete disappearance of her migraine headaches.  Since that time, she was getting some residual palpitations, only in the late evening. She then switched her medication to QHS dosing and noted an improvement. Today she is pretty much without complaints.  Susan Valencia returns today for follow-up. Overall she seems to be doing very well. She reports a propranolol works well for her palpitations and affect his done a good job of keeping her from having migraine headaches. She's had no side effects from Lipitor and is due for repeat testing. She denies any chest pain or worsening shortness of breath. Her only main concerns now are hot flashes and symptoms of being perimenopausal.  04/06/2016  Susan Valencia was seen today in follow-up. Overall she feels fairly well. She is still having some intermittent hot flashes. She says that her propranolol generally works for her palpitations although the other day she had palpitations mostly through one night. Her heart rate got up into the low 80s according to her fit but watch. She denies any fatigue with these episodes. Recently blood pressure was noted to be increased. Her primary care provider put her on low-dose losartan. This seems to be working for her. She is due for repeat testing of her lipid profile which was well controlled year ago. Weight is up about 11 pounds since her last office visit. She was recently found to be mildly hypothyroid and is on low-dose levothyroxine.  03/08/2017  Susan Valencia returns today for follow-up.  She reports some occasional palpitations that are  breaking through her propranolol.  She has well-controlled TSH and is on alternating levothyroxine 50/25 mcg every other day.  She also takes atorvastatin and losartan.  Blood pressures well controlled today at 130/82.  Thyroid and cholesterol followed by her primary care provider and were performed recently.  10/03/2017  Susan Valencia returns today for follow-up.  She again gets some palpitations, mostly in the evening and in the middle the night.  She recently saw her PCP who referred her for a sleep study however her insurance would not cover her study without a home study.  That is scheduled next week.  She denies any chest pain or worsening shortness of breath.  Blood pressure is reasonably well controlled.  Thyroid function is been normal.  01/07/2020  Susan Valencia returns today for follow-up. Overall she reports good control of her blood pressure which has been about the 120s. Today was elevated somewhat. She has had palpitations but is generally at night when she lays on the left side. She feels like her control is pretty good with propranolol. She is also on some losartan. She struggled with sleep apnea and had 16 AHI during her sleep study but cannot tolerate CPAP. She is considering an oral appliance.  03/08/2021  Susan Valencia returns today for follow-up.  Overall she is doing well although she has been having a little more palpitations mostly at night and when she lays on her left side.  Although this has been well controlled in the past, she said recently she has been having more.  Blood pressure is  good today 110/80.  She was only on low-dose propranolol.  She reports pretty good control of her migraine headaches as well.  EKG shows normal sinus rhythm with some nonspecific T wave changes  03/15/2022  Susan Valencia returns today for follow-up.  She is having persistent palpitations.  This seems to be much worse at night.  She is also been having issues with hot flashes and had one today in the office.   EKG shows a normal sinus rhythm without any ectopic beats.  He had worn a Holter monitor back in 2018 which showed sinus rhythms and occasional PACs and PVCs.  I suspect she is having more of these but we will go ahead and place a 2-week monitor to see if we can better conceptualize this.  I had previously increased her propranolol from 60 to 80 mg that she notes no improvement with that.  05/30/2022  Susan Valencia returns today for follow-up of her palpitations.  She wore a monitor in early December for 2 weeks.  This appeared essentially normal.  There were small number of extra beats but this did not seem to correlate with any symptoms or explain her palpitations.  She reports that her palpitations are typically worse at night.  She does have a history of mild obstructive sleep apnea with an AHI of 16 but did not tolerate CPAP in the past.  She has generally been taking her propranolol at night but higher doses have not been helpful.  We discussed the possibility of switching to a more cardioselective beta-blocker and she seemed interested in that.  It is also possible that her palpitations are unrelated to extra beats.  PMHx:  Past Medical History:  Diagnosis Date   ASCUS (atypical squamous cells of undetermined significance) on Pap smear 2009   Daytime sleepiness    Dyslipidemia    Fibroid 2009   Hydrosalpinx 2009   Hyperlipidemia    Hypertension    Hypothyroidism    Menorrhagia 2010   Migraine    with menses   Osteopenia 2006   Osteoporosis 2006   Palpitations    Pelvic mass    Snoring    Systolic murmur 2956   Tachycardia 2013    Past Surgical History:  Procedure Laterality Date   CESAREAN SECTION     hysteroscopic resection     TRANSTHORACIC ECHOCARDIOGRAM  07/22/2011   EF >55%, normal    FAMHx:  Family History  Problem Relation Age of Onset   Cancer Mother        premenopausal breast    Anuerysm Mother        abdominal and cerebral   Hypertension Father    Heart disease  Father 57   Thyroid disease Sister    Cancer Sister        premenopausal breast   Thyroid disease Sister    Anuerysm Sister    Thyroid disease Brother     SOCHx:   reports that she has never smoked. She has never used smokeless tobacco. She reports current alcohol use. She reports that she does not use drugs.  ALLERGIES:  Allergies  Allergen Reactions   Multihance [Gadobenate Dimeglumine] Nausea And Vomiting    Pt has nausea and vomiting after injection of multihance for mri.    ROS: Pertinent items noted in HPI and remainder of comprehensive ROS otherwise negative.  HOME MEDS: Current Outpatient Medications  Medication Sig Dispense Refill   atorvastatin (LIPITOR) 40 MG tablet TAKE 1 TABLET BY MOUTH DAILY  90 tablet 0   Calcium Carbonate-Vitamin D (CALCIUM 500 + D PO) Take 2 tablets by mouth daily.     denosumab (PROLIA) 60 MG/ML SOSY injection INJECT 1 ML UNDER THE SKIN EVERY 6 MONTHS     EVENING PRIMROSE OIL PO Take by mouth daily.     IBUPROFEN PO Take by mouth as needed.     levothyroxine (SYNTHROID) 125 MCG tablet Take 0.5 tablets (62.5 mcg total) by mouth daily. 45 tablet 3   losartan (COZAAR) 50 MG tablet TAKE 1 TABLET BY MOUTH EVERY DAY 90 tablet 0   MAGNESIUM PO Take 400 mg by mouth daily.      Melatonin 2.5 MG CAPS Take 1 capsule by mouth at bedtime as needed. Take 1 tablet by mouth once daily at bedtime as needed for sleep.     Multiple Vitamin (MULTIVITAMIN) tablet Take 1 tablet by mouth daily.     propranolol ER (INDERAL LA) 80 MG 24 hr capsule Take 1 capsule (80 mg total) by mouth daily. 30 capsule 3   No current facility-administered medications for this visit.    LABS/IMAGING: No results found for this or any previous visit (from the past 48 hour(s)). No results found.  VITALS: BP 118/72 (BP Location: Left Arm, Patient Position: Sitting, Cuff Size: Large)   Pulse 71   Ht '5\' 3"'$  (1.6 m)   Wt 192 lb 3.2 oz (87.2 kg)   SpO2 98%   BMI 34.05 kg/m    EXAM: Deferred  EKG: Deferred  ASSESSMENT: Palpitations Migraine headaches - improved with b-blocker Dyslipidemia-stable on Lipitor Essential hypertension Hypothyroidism OSA not on CPAP  PLAN: 1.   Susan Valencia has had some breakthrough palpitations mostly at night on her propranolol.  Increasing doses has not been helpful.  I advised switching to a different beta-blocker metoprolol.  This could still give some benefit for her migraine headaches.  She can contact me with any worsening symptoms and plan otherwise follow-up in 6 months or sooner as necessary.  Pixie Casino, MD, [facility], [place] Director of the Advanced Lipid Disorders &  Cardiovascular Risk Reduction Clinic Diplomate of the American Board of Clinical Lipidology Attending Cardiologist  Direct Dial: [phone]  Fax: [phone]  Website:  [email or web address] 05/30/2022, 11:41 AM

## 2022-05-30 NOTE — Patient Instructions (Signed)
Medication Instructions:   STOP propranolol  START metoprolol succinate '25mg'$  daily at nighttimes  *If you need a refill on your cardiac medications before your next appointment, please call your pharmacy*    Follow-Up: At [facility], you and your health needs are our priority.  As part of our continuing mission to provide you with exceptional heart care, we have created designated Provider Care Teams.  These Care Teams include your primary Cardiologist (physician) and Advanced Practice Providers (APPs -  Physician Assistants and Nurse Practitioners) who all work together to provide you with the care you need, when you need it.  We recommend signing up for the patient portal called "MyChart".  Sign up information is provided on this After Visit Summary.  MyChart is used to connect with patients for Virtual Visits (Telemedicine).  Patients are able to view lab/test results, encounter notes, upcoming appointments, etc.  Non-urgent messages can be sent to your provider as well.   To learn more about what you can do with MyChart, go to [email or web address].    Your next appointment:    6 months with Dr. Debara Pickett  ** call end of Jan or early Feb for an appointment

## 2022-06-02 DIAGNOSIS — Z0000 Encounter for general adult medical examination without abnormal findings: Secondary | ICD-10-CM | POA: Diagnosis not present

## 2022-06-02 DIAGNOSIS — I129 Hypertensive chronic kidney disease with stage 1 through stage 4 chronic kidney disease, or unspecified chronic kidney disease: Secondary | ICD-10-CM | POA: Diagnosis not present

## 2022-06-02 DIAGNOSIS — Z1389 Encounter for screening for other disorder: Secondary | ICD-10-CM | POA: Diagnosis not present

## 2022-06-02 DIAGNOSIS — F5101 Primary insomnia: Secondary | ICD-10-CM | POA: Diagnosis not present

## 2022-06-02 DIAGNOSIS — E7800 Pure hypercholesterolemia, unspecified: Secondary | ICD-10-CM | POA: Diagnosis not present

## 2022-06-02 DIAGNOSIS — N1831 Chronic kidney disease, stage 3a: Secondary | ICD-10-CM | POA: Diagnosis not present

## 2022-06-02 DIAGNOSIS — M810 Age-related osteoporosis without current pathological fracture: Secondary | ICD-10-CM | POA: Diagnosis not present

## 2022-06-02 DIAGNOSIS — Z6835 Body mass index (BMI) 35.0-35.9, adult: Secondary | ICD-10-CM | POA: Diagnosis not present

## 2022-06-02 DIAGNOSIS — E039 Hypothyroidism, unspecified: Secondary | ICD-10-CM | POA: Diagnosis not present

## 2022-06-03 ENCOUNTER — Ambulatory Visit: Payer: PPO | Admitting: Internal Medicine

## 2022-06-17 DIAGNOSIS — M810 Age-related osteoporosis without current pathological fracture: Secondary | ICD-10-CM | POA: Diagnosis not present

## 2022-08-04 ENCOUNTER — Other Ambulatory Visit (INDEPENDENT_AMBULATORY_CARE_PROVIDER_SITE_OTHER): Payer: PPO

## 2022-08-04 DIAGNOSIS — E063 Autoimmune thyroiditis: Secondary | ICD-10-CM | POA: Diagnosis not present

## 2022-08-04 LAB — TSH: TSH: 3.2 u[IU]/mL (ref 0.35–5.50)

## 2022-08-04 LAB — T4, FREE: Free T4: 1.02 ng/dL (ref 0.60–1.60)

## 2022-08-09 ENCOUNTER — Encounter: Payer: Self-pay | Admitting: Endocrinology

## 2022-08-09 ENCOUNTER — Ambulatory Visit: Payer: PPO | Admitting: Endocrinology

## 2022-08-09 VITALS — BP 140/80 | HR 85 | Ht 63.0 in | Wt 196.0 lb

## 2022-08-09 DIAGNOSIS — E063 Autoimmune thyroiditis: Secondary | ICD-10-CM | POA: Diagnosis not present

## 2022-08-09 MED ORDER — LEVOTHYROXINE SODIUM 125 MCG PO TABS: 62.5000 ug | ORAL_TABLET | Freq: Every day | ORAL | 3 refills | Status: DC

## 2022-08-09 NOTE — Progress Notes (Signed)
Patient ID: Susan Valencia, female   DOB: Dec 18, 1955, 67 y.o.   MRN: [id number]             Reason for Appointment:  Hypothyroidism, follow-up visit    History of Present Illness:   Hypothyroidism was first diagnosed in 2017  At the time of diagnosis patient was not having any symptoms of  fatigue, cold sensitivity, difficulty concentrating, dry skin or hair loss .      She was having some difficulty losing weight as before She thinks her labs were tested because of family history of thyroid disease as well as routine annual visits       The patient has been treated with  either 25 or 50 mcg of levothyroxine since 2017 She thinks that her initial dose may have been excessive and she was told to cut back With starting thyroid supplementation she did not feel any different and also had not felt any different with adjusting her dosage up or down  RECENT history:  Since it was unclear whether she has subclinical hypothyroidism or not she was told to stop her 50 mcg levothyroxine on her initial consultation in October 2020 She did not feel any different with stopping her supplement but her TSH was back up to 10.3 as of 04/2019  She was on levothyroxine 50 mcg daily since 12/20 Did not feel any different with starting the levothyroxine supplement  She is now taking 125 mcg, half tablet daily  She did not feel any different or better with increasing her levothyroxine on the last dosage change in 6/23  She is regular with taking her levothyroxine daily about 30 to 60 minutes before breakfast Takes her calcium 4 hours later No complaints of unusual fatigue, she is concerned about difficulty with losing weight  No cold intolerance  TSH is again normal         Patient's weight history is as follows:  Wt Readings from Last 3 Encounters:  08/09/22 196 lb (88.9 kg)  05/30/22 192 lb 3.2 oz (87.2 kg)  03/15/22 193 lb 4.8 oz (87.7 kg)    Thyroid function results have been as  follows:   Lab Results  Component Value Date   TSH 3.20 08/04/2022   TSH 3.88 02/01/2022   TSH 6.37 (H) 11/02/2021   TSH 1.88 11/03/2020   FREET4 1.02 08/04/2022   FREET4 1.06 02/01/2022   FREET4 1.12 11/02/2021   FREET4 0.97 11/03/2020    On her initial visit she was felt to have a possible left thyroid nodule on exam  THYROID ultrasound in 03/2019: Showed the following  Moderately heterogeneous thyroid gland without evidence for distinct thyroid nodule.   Past Medical History:  Diagnosis Date   ASCUS (atypical squamous cells of undetermined significance) on Pap smear 2009   Daytime sleepiness    Dyslipidemia    Fibroid 2009   Hydrosalpinx 2009   Hyperlipidemia    Hypertension    Hypothyroidism    Menorrhagia 2010   Migraine    with menses   Osteopenia 2006   Osteoporosis 2006   Palpitations    Pelvic mass    Snoring    Systolic murmur 0000000   Tachycardia 2013    Past Surgical History:  Procedure Laterality Date   CESAREAN SECTION     hysteroscopic resection     TRANSTHORACIC ECHOCARDIOGRAM  07/22/2011   EF >55%, normal    Family History  Problem Relation Age of Onset   Cancer Mother  premenopausal breast    Anuerysm Mother        abdominal and cerebral   Hypertension Father    Heart disease Father 36   Thyroid disease Sister    Cancer Sister        premenopausal breast   Thyroid disease Sister    Anuerysm Sister    Thyroid disease Brother     Social History:  reports that she has never smoked. She has never used smokeless tobacco. She reports current alcohol use. She reports that she does not use drugs.  Allergies:  Allergies  Allergen Reactions   Multihance [Gadobenate Dimeglumine] Nausea And Vomiting    Pt has nausea and vomiting after injection of multihance for mri.    Allergies as of 08/09/2022       Reactions   Multihance [gadobenate Dimeglumine] Nausea And Vomiting   Pt has nausea and vomiting after injection of multihance  for mri.        Medication List        Accurate as of August 09, 2022 10:35 AM. If you have any questions, ask your nurse or doctor.          atorvastatin 40 MG tablet Commonly known as: LIPITOR TAKE 1 TABLET BY MOUTH DAILY   CALCIUM 500 + D PO Take 2 tablets by mouth daily.   EVENING PRIMROSE OIL PO Take by mouth daily.   IBUPROFEN PO Take by mouth as needed.   levothyroxine 125 MCG tablet Commonly known as: SYNTHROID Take 0.5 tablets (62.5 mcg total) by mouth daily.   losartan 50 MG tablet Commonly known as: COZAAR TAKE 1 TABLET BY MOUTH EVERY DAY   MAGNESIUM PO Take 400 mg by mouth daily.   Melatonin 2.5 MG Caps Take 1 capsule by mouth at bedtime as needed. Take 1 tablet by mouth once daily at bedtime as needed for sleep.   metoprolol succinate 25 MG 24 hr tablet Commonly known as: TOPROL-XL Take 1 tablet (25 mg total) by mouth at bedtime.   multivitamin tablet Take 1 tablet by mouth daily.   Prolia 60 MG/ML Sosy injection Generic drug: denosumab INJECT 1 ML UNDER THE SKIN EVERY 6 MONTHS           Review of Systems     History of OSTEOPOROSIS followed by gynecologist and now switched to Prolia instead of Fosamax Bone density information not available   Examination:    BP (!) 140/80 (BP Location: Left Arm, Patient Position: Sitting, Cuff Size: Normal)   Pulse 85   Ht 5\' 3"  (1.6 m)   Wt 196 lb (88.9 kg)   SpO2 98%   BMI 34.72 kg/m   Thyroid not palpable Biceps reflexes slightly brisk  Assessment:  HYPOTHYROIDISM, mild on supplementation  She has not had any symptoms even with her TSH at baseline about 10 Clinical diagnosis is Hashimoto's thyroiditis as seen on her ultrasound of the thyroid Also has significant family history of hypothyroidism  Previously did not feel any different with increasing her dose on the last visit when TSH was 6.4  TSH is back to normal with continuing her dose of levothyroxine 125 mcg, half tablet  daily  Has no goiter on exam today also   PLAN:   Continue 125 mcg, half tablet daily and follow-up in 6 months Keep the levothyroxine separate from calcium and iron supplements  Elayne Snare 08/09/2022, 10:35 AM     Note: This office note was prepared with Dragon voice recognition system technology.  Any transcriptional errors that result from this process are unintentional.   

## 2022-08-09 NOTE — Patient Instructions (Signed)
SAME DOSE

## 2022-08-20 ENCOUNTER — Other Ambulatory Visit: Payer: Self-pay | Admitting: Internal Medicine

## 2022-10-19 ENCOUNTER — Other Ambulatory Visit: Payer: Self-pay | Admitting: Endocrinology

## 2022-12-19 DIAGNOSIS — R635 Abnormal weight gain: Secondary | ICD-10-CM | POA: Diagnosis not present

## 2022-12-19 DIAGNOSIS — E7800 Pure hypercholesterolemia, unspecified: Secondary | ICD-10-CM | POA: Diagnosis not present

## 2022-12-19 DIAGNOSIS — M810 Age-related osteoporosis without current pathological fracture: Secondary | ICD-10-CM | POA: Diagnosis not present

## 2022-12-19 DIAGNOSIS — E039 Hypothyroidism, unspecified: Secondary | ICD-10-CM | POA: Diagnosis not present

## 2022-12-19 DIAGNOSIS — I10 Essential (primary) hypertension: Secondary | ICD-10-CM | POA: Diagnosis not present

## 2022-12-20 DIAGNOSIS — Z1231 Encounter for screening mammogram for malignant neoplasm of breast: Secondary | ICD-10-CM | POA: Diagnosis not present

## 2022-12-20 DIAGNOSIS — M810 Age-related osteoporosis without current pathological fracture: Secondary | ICD-10-CM | POA: Diagnosis not present

## 2022-12-26 DIAGNOSIS — M79662 Pain in left lower leg: Secondary | ICD-10-CM | POA: Diagnosis not present

## 2023-01-19 DIAGNOSIS — M79605 Pain in left leg: Secondary | ICD-10-CM | POA: Diagnosis not present

## 2023-01-21 DIAGNOSIS — M79662 Pain in left lower leg: Secondary | ICD-10-CM | POA: Diagnosis not present

## 2023-01-24 DIAGNOSIS — M79605 Pain in left leg: Secondary | ICD-10-CM | POA: Diagnosis not present

## 2023-01-24 DIAGNOSIS — M84362D Stress fracture, left tibia, subsequent encounter for fracture with routine healing: Secondary | ICD-10-CM | POA: Diagnosis not present

## 2023-01-24 DIAGNOSIS — M84362A Stress fracture, left tibia, initial encounter for fracture: Secondary | ICD-10-CM | POA: Diagnosis not present

## 2023-02-03 ENCOUNTER — Ambulatory Visit (HOSPITAL_BASED_OUTPATIENT_CLINIC_OR_DEPARTMENT_OTHER): Payer: PPO | Admitting: Internal Medicine

## 2023-02-03 VITALS — BP 144/88 | HR 93 | Ht 63.0 in | Wt 190.0 lb

## 2023-02-03 DIAGNOSIS — E782 Mixed hyperlipidemia: Secondary | ICD-10-CM | POA: Diagnosis not present

## 2023-02-03 DIAGNOSIS — R002 Palpitations: Secondary | ICD-10-CM

## 2023-02-03 DIAGNOSIS — I10 Essential (primary) hypertension: Secondary | ICD-10-CM | POA: Diagnosis not present

## 2023-02-03 MED ORDER — METOPROLOL SUCCINATE ER 50 MG PO TB24: 50.0000 mg | ORAL_TABLET | Freq: Every day | ORAL | 3 refills | Status: DC

## 2023-02-03 NOTE — Patient Instructions (Signed)
Medication Instructions:  INCREASE metoprolol succinate to 50mg  at nighttime  *If you need a refill on your cardiac medications before your next appointment, please call your pharmacy*    Follow-Up: At [facility], you and your health needs are our priority.  As part of our continuing mission to provide you with exceptional heart care, we have created designated Provider Care Teams.  These Care Teams include your primary Cardiologist (physician) and Advanced Practice Providers (APPs -  Physician Assistants and Nurse Practitioners) who all work together to provide you with the care you need, when you need it.  We recommend signing up for the patient portal called "MyChart".  Sign up information is provided on this After Visit Summary.  MyChart is used to connect with patients for Virtual Visits (Telemedicine).  Patients are able to view lab/test results, encounter notes, upcoming appointments, etc.  Non-urgent messages can be sent to your provider as well.   To learn more about what you can do with MyChart, go to [email or web address].    Your next appointment:    6 months with Dr. Rennis Golden

## 2023-02-03 NOTE — Progress Notes (Signed)
OFFICE NOTE  Chief Complaint:  Follow-up palpitations  Primary Care Physician: Sigmund Hazel, MD  HPI:  Susan Valencia is a 67 yo female with a history of palpitations, elevated BP, migraine headaches and soft flow murmur. After our first visit, I elected to start her on low dose propranolol XR 60 mg daily. She reported a marked improvement in her palpitations and almost a complete disappearance of her migraine headaches.  Since that time, she was getting some residual palpitations, only in the late evening. She then switched her medication to QHS dosing and noted an improvement. Today she is pretty much without complaints.  Susan Valencia returns today for follow-up. Overall she seems to be doing very well. She reports a propranolol works well for her palpitations and affect his done a good job of keeping her from having migraine headaches. She's had no side effects from Lipitor and is due for repeat testing. She denies any chest pain or worsening shortness of breath. Her only main concerns now are hot flashes and symptoms of being perimenopausal.  04/06/2016  Susan Valencia was seen today in follow-up. Overall she feels fairly well. She is still having some intermittent hot flashes. She says that her propranolol generally works for her palpitations although the other day she had palpitations mostly through one night. Her heart rate got up into the low 80s according to her fit but watch. She denies any fatigue with these episodes. Recently blood pressure was noted to be increased. Her primary care provider put her on low-dose losartan. This seems to be working for her. She is due for repeat testing of her lipid profile which was well controlled year ago. Weight is up about 11 pounds since her last office visit. She was recently found to be mildly hypothyroid and is on low-dose levothyroxine.  03/08/2017  Susan Valencia returns today for follow-up.  She reports some occasional palpitations that are  breaking through her propranolol.  She has well-controlled TSH and is on alternating levothyroxine 50/25 mcg every other day.  She also takes atorvastatin and losartan.  Blood pressures well controlled today at 130/82.  Thyroid and cholesterol followed by her primary care provider and were performed recently.  10/03/2017  Susan Valencia returns today for follow-up.  She again gets some palpitations, mostly in the evening and in the middle the night.  She recently saw her PCP who referred her for a sleep study however her insurance would not cover her study without a home study.  That is scheduled next week.  She denies any chest pain or worsening shortness of breath.  Blood pressure is reasonably well controlled.  Thyroid function is been normal.  01/07/2020  Susan Valencia returns today for follow-up. Overall she reports good control of her blood pressure which has been about the 120s. Today was elevated somewhat. She has had palpitations but is generally at night when she lays on the left side. She feels like her control is pretty good with propranolol. She is also on some losartan. She struggled with sleep apnea and had 16 AHI during her sleep study but cannot tolerate CPAP. She is considering an oral appliance.  03/08/2021  Susan Valencia returns today for follow-up.  Overall she is doing well although she has been having a little more palpitations mostly at night and when she lays on her left side.  Although this has been well controlled in the past, she said recently she has been having more.  Blood pressure is  good today 110/80.  She was only on low-dose propranolol.  She reports pretty good control of her migraine headaches as well.  EKG shows normal sinus rhythm with some nonspecific T wave changes  03/15/2022  Susan Valencia returns today for follow-up.  She is having persistent palpitations.  This seems to be much worse at night.  She is also been having issues with hot flashes and had one today in the office.   EKG shows a normal sinus rhythm without any ectopic beats.  He had worn a Holter monitor back in 2018 which showed sinus rhythms and occasional PACs and PVCs.  I suspect she is having more of these but we will go ahead and place a 2-week monitor to see if we can better conceptualize this.  I had previously increased her propranolol from 60 to 80 mg that she notes no improvement with that.  05/30/2022  Susan Valencia returns today for follow-up of her palpitations.  She wore a monitor in early December for 2 weeks.  This appeared essentially normal.  There were small number of extra beats but this did not seem to correlate with any symptoms or explain her palpitations.  She reports that her palpitations are typically worse at night.  She does have a history of mild obstructive sleep apnea with an AHI of 16 but did not tolerate CPAP in the past.  She has generally been taking her propranolol at night but higher doses have not been helpful.  We discussed the possibility of switching to a more cardioselective beta-blocker and she seemed interested in that.  It is also possible that her palpitations are unrelated to extra beats.  02/03/2023  Susan Valencia is seen today in follow-up.  He is again having concerns about palpitations which are primarily at night.  She had switched her beta-blocker to take in the evening.  Blood pressure today was elevated as well as heart rate in the 90s.  She had injured her left foot and is on crutches.  PMHx:  Past Medical History:  Diagnosis Date   ASCUS (atypical squamous cells of undetermined significance) on Pap smear 2009   Daytime sleepiness    Dyslipidemia    Fibroid 2009   Hydrosalpinx 2009   Hyperlipidemia    Hypertension    Hypothyroidism    Menorrhagia 2010   Migraine    with menses   Osteopenia 2006   Osteoporosis 2006   Palpitations    Pelvic mass    Snoring    Systolic murmur 2013   Tachycardia 2013    Past Surgical History:  Procedure Laterality Date    CESAREAN SECTION     hysteroscopic resection     TRANSTHORACIC ECHOCARDIOGRAM  07/22/2011   EF >55%, normal    FAMHx:  Family History  Problem Relation Age of Onset   Cancer Mother        premenopausal breast    Anuerysm Mother        abdominal and cerebral   Hypertension Father    Heart disease Father 16   Thyroid disease Sister    Cancer Sister        premenopausal breast   Thyroid disease Sister    Anuerysm Sister    Thyroid disease Brother     SOCHx:   reports that she has never smoked. She has never used smokeless tobacco. She reports current alcohol use. She reports that she does not use drugs.  ALLERGIES:  Allergies  Allergen Reactions   Multihance [  Gadobenate Dimeglumine] Nausea And Vomiting    Pt has nausea and vomiting after injection of multihance for mri.    ROS: Pertinent items noted in HPI and remainder of comprehensive ROS otherwise negative.  HOME MEDS: Current Outpatient Medications  Medication Sig Dispense Refill   atorvastatin (LIPITOR) 40 MG tablet TAKE 1 TABLET BY MOUTH DAILY 90 tablet 1   denosumab (PROLIA) 60 MG/ML SOSY injection INJECT 1 ML UNDER THE SKIN EVERY 6 MONTHS     EVENING PRIMROSE OIL PO Take by mouth daily.     IBUPROFEN PO Take by mouth as needed.     levothyroxine (SYNTHROID) 75 MCG tablet SMARTSIG:1 Tablet(s) By Mouth 6 Times a Week     losartan (COZAAR) 50 MG tablet TAKE 1 TABLET BY MOUTH EVERY DAY 90 tablet 1   MAGNESIUM PO Take 400 mg by mouth daily.      metoprolol succinate (TOPROL-XL) 25 MG 24 hr tablet Take 1 tablet (25 mg total) by mouth at bedtime. 90 tablet 3   Multiple Vitamin (MULTIVITAMIN) tablet Take 1 tablet by mouth daily.     naproxen (NAPROSYN) 500 MG tablet SMARTSIG:1 Tablet(s) By Mouth Every 12 Hours     PREVIDENT 5000 BOOSTER PLUS 1.1 % PSTE Place onto teeth daily.     Calcium Carbonate-Vitamin D (CALCIUM 500 + D PO) Take 2 tablets by mouth daily.     Melatonin 2.5 MG CAPS Take 1 capsule by mouth at bedtime  as needed. Take 1 tablet by mouth once daily at bedtime as needed for sleep.     No current facility-administered medications for this visit.    LABS/IMAGING: No results found for this or any previous visit (from the past 48 hour(s)). No results found.  VITALS: BP (!) 144/88   Pulse 93   Ht 5\' 3"  (1.6 m)   Wt 190 lb (86.2 kg)   SpO2 95%   BMI 33.66 kg/m   EXAM: Deferred  EKG: EKG Interpretation Date/Time:  Friday February 03 2023 13:33:09 EDT Ventricular Rate:  93 PR Interval:  154 QRS Duration:  80 QT Interval:  344 QTC Calculation: 427 R Axis:   46  Text Interpretation: Normal sinus rhythm Normal ECG No significant change since last tracing Confirmed by Zoila Shutter [phone]) on 02/03/2023 1:41:39 PM    ASSESSMENT: Palpitations Migraine headaches - improved with b-blocker Dyslipidemia-stable on Lipitor Essential hypertension Hypothyroidism OSA not on CPAP  PLAN: 1.   Susan Valencia continues to have palpitations primarily at night.  As there is room with the blood pressure and heart rate I would advise increasing the metoprolol to 50 mg at night.  Hopefully this will give her more beneficial effects.  Otherwise no changes in her medicines.  Plan follow-up with me in 6 months or sooner as necessary.  Chrystie Nose, MD, [facility], FACP  [place]  [facility]  Medical Director of the [facility]  Card[facility]ovascular [facility] Reduction [facility] Diplomate of the American Board of Clinical Lipidology Attending Cardiologist  Direct Dial: [phone]  Fax: [phone]  Website:  [email or web address] Darilyn [email or web address] 02/03/2023, 1:41 PM

## 2023-02-07 DIAGNOSIS — M84362D Stress fracture, left tibia, subsequent encounter for fracture with routine healing: Secondary | ICD-10-CM | POA: Diagnosis not present

## 2023-02-13 ENCOUNTER — Encounter (HOSPITAL_BASED_OUTPATIENT_CLINIC_OR_DEPARTMENT_OTHER): Payer: Self-pay | Admitting: Internal Medicine

## 2023-02-13 MED ORDER — METOPROLOL SUCCINATE ER 50 MG PO TB24: 25.0000 mg | ORAL_TABLET | Freq: Every day | ORAL | Status: DC

## 2023-02-14 ENCOUNTER — Other Ambulatory Visit: Payer: Self-pay | Admitting: Internal Medicine

## 2023-02-27 ENCOUNTER — Other Ambulatory Visit: Payer: Self-pay | Admitting: *Deleted

## 2023-02-27 DIAGNOSIS — E785 Hyperlipidemia, unspecified: Secondary | ICD-10-CM

## 2023-03-06 DIAGNOSIS — M84362S Stress fracture, left tibia, sequela: Secondary | ICD-10-CM | POA: Diagnosis not present

## 2023-03-15 DIAGNOSIS — M810 Age-related osteoporosis without current pathological fracture: Secondary | ICD-10-CM | POA: Diagnosis not present

## 2023-03-15 DIAGNOSIS — Z780 Asymptomatic menopausal state: Secondary | ICD-10-CM | POA: Diagnosis not present

## 2023-03-23 DIAGNOSIS — M84362S Stress fracture, left tibia, sequela: Secondary | ICD-10-CM | POA: Diagnosis not present

## 2023-03-29 DIAGNOSIS — H40013 Open angle with borderline findings, low risk, bilateral: Secondary | ICD-10-CM | POA: Diagnosis not present

## 2023-03-30 DIAGNOSIS — M84362D Stress fracture, left tibia, subsequent encounter for fracture with routine healing: Secondary | ICD-10-CM | POA: Diagnosis not present

## 2023-03-30 DIAGNOSIS — M25672 Stiffness of left ankle, not elsewhere classified: Secondary | ICD-10-CM | POA: Diagnosis not present

## 2023-03-30 DIAGNOSIS — M6281 Muscle weakness (generalized): Secondary | ICD-10-CM | POA: Diagnosis not present

## 2023-04-04 DIAGNOSIS — M25672 Stiffness of left ankle, not elsewhere classified: Secondary | ICD-10-CM | POA: Diagnosis not present

## 2023-04-04 DIAGNOSIS — M6281 Muscle weakness (generalized): Secondary | ICD-10-CM | POA: Diagnosis not present

## 2023-04-04 DIAGNOSIS — M84362D Stress fracture, left tibia, subsequent encounter for fracture with routine healing: Secondary | ICD-10-CM | POA: Diagnosis not present

## 2023-04-11 DIAGNOSIS — M84362D Stress fracture, left tibia, subsequent encounter for fracture with routine healing: Secondary | ICD-10-CM | POA: Diagnosis not present

## 2023-04-11 DIAGNOSIS — M6281 Muscle weakness (generalized): Secondary | ICD-10-CM | POA: Diagnosis not present

## 2023-04-11 DIAGNOSIS — M25672 Stiffness of left ankle, not elsewhere classified: Secondary | ICD-10-CM | POA: Diagnosis not present

## 2023-04-18 DIAGNOSIS — R635 Abnormal weight gain: Secondary | ICD-10-CM | POA: Diagnosis not present

## 2023-04-18 DIAGNOSIS — E039 Hypothyroidism, unspecified: Secondary | ICD-10-CM | POA: Diagnosis not present

## 2023-04-18 DIAGNOSIS — M84362D Stress fracture, left tibia, subsequent encounter for fracture with routine healing: Secondary | ICD-10-CM | POA: Diagnosis not present

## 2023-04-18 DIAGNOSIS — M25672 Stiffness of left ankle, not elsewhere classified: Secondary | ICD-10-CM | POA: Diagnosis not present

## 2023-04-18 DIAGNOSIS — M6281 Muscle weakness (generalized): Secondary | ICD-10-CM | POA: Diagnosis not present

## 2023-04-20 DIAGNOSIS — M6281 Muscle weakness (generalized): Secondary | ICD-10-CM | POA: Diagnosis not present

## 2023-04-20 DIAGNOSIS — M84362D Stress fracture, left tibia, subsequent encounter for fracture with routine healing: Secondary | ICD-10-CM | POA: Diagnosis not present

## 2023-04-20 DIAGNOSIS — M25672 Stiffness of left ankle, not elsewhere classified: Secondary | ICD-10-CM | POA: Diagnosis not present

## 2023-04-20 DIAGNOSIS — M84362S Stress fracture, left tibia, sequela: Secondary | ICD-10-CM | POA: Diagnosis not present

## 2023-04-25 DIAGNOSIS — M6281 Muscle weakness (generalized): Secondary | ICD-10-CM | POA: Diagnosis not present

## 2023-04-25 DIAGNOSIS — I10 Essential (primary) hypertension: Secondary | ICD-10-CM | POA: Diagnosis not present

## 2023-04-25 DIAGNOSIS — E039 Hypothyroidism, unspecified: Secondary | ICD-10-CM | POA: Diagnosis not present

## 2023-04-25 DIAGNOSIS — M84362D Stress fracture, left tibia, subsequent encounter for fracture with routine healing: Secondary | ICD-10-CM | POA: Diagnosis not present

## 2023-04-25 DIAGNOSIS — M25672 Stiffness of left ankle, not elsewhere classified: Secondary | ICD-10-CM | POA: Diagnosis not present

## 2023-04-25 DIAGNOSIS — M810 Age-related osteoporosis without current pathological fracture: Secondary | ICD-10-CM | POA: Diagnosis not present

## 2023-04-25 DIAGNOSIS — E7800 Pure hypercholesterolemia, unspecified: Secondary | ICD-10-CM | POA: Diagnosis not present

## 2023-04-25 DIAGNOSIS — R635 Abnormal weight gain: Secondary | ICD-10-CM | POA: Diagnosis not present

## 2023-04-27 DIAGNOSIS — M84362D Stress fracture, left tibia, subsequent encounter for fracture with routine healing: Secondary | ICD-10-CM | POA: Diagnosis not present

## 2023-04-27 DIAGNOSIS — M6281 Muscle weakness (generalized): Secondary | ICD-10-CM | POA: Diagnosis not present

## 2023-04-27 DIAGNOSIS — M25672 Stiffness of left ankle, not elsewhere classified: Secondary | ICD-10-CM | POA: Diagnosis not present

## 2023-05-11 ENCOUNTER — Other Ambulatory Visit: Payer: Self-pay | Admitting: Internal Medicine

## 2023-05-12 ENCOUNTER — Telehealth: Payer: Self-pay | Admitting: Internal Medicine

## 2023-05-12 MED ORDER — METOPROLOL SUCCINATE ER 50 MG PO TB24: 25.0000 mg | ORAL_TABLET | Freq: Every day | ORAL | 2 refills | Status: DC

## 2023-05-12 NOTE — Telephone Encounter (Signed)
 Pt's medication was sent to pt's pharmacy as requested. Confirmation received.

## 2023-05-12 NOTE — Telephone Encounter (Signed)
*  STAT* If patient is at the pharmacy, call can be transferred to refill team.   1. Which medications need to be refilled? (please list name of each medication and dose if known) metoprolol  succinate (TOPROL -XL) 50 MG 24 hr tablet    4. Which pharmacy/location (including street and city if local pharmacy) is medication to be sent to?[id number] - [place], [place] - [address] AT [facility] [address]    5. Do they need a 30 day or 90 day supply? 90

## 2023-05-14 ENCOUNTER — Other Ambulatory Visit: Payer: Self-pay | Admitting: Internal Medicine

## 2023-05-15 ENCOUNTER — Telehealth: Payer: Self-pay | Admitting: Internal Medicine

## 2023-05-15 MED ORDER — METOPROLOL SUCCINATE ER 25 MG PO TB24: 25.0000 mg | ORAL_TABLET | Freq: Every day | ORAL | 3 refills | Status: DC

## 2023-05-15 NOTE — Telephone Encounter (Signed)
 Pt c/o medication issue:  1. Name of Medication:   metoprolol  succinate (TOPROL -XL) 50 MG 24 hr tablet    2. How are you currently taking this medication (dosage and times per day)? As written   3. Are you having a reaction (difficulty breathing--STAT)? No   4. What is your medication issue? Pt called in stating she would like to change this rx to 25 mg tablets take one daily instead of 50mg  tablets take 0.5 daily. Please advise.

## 2023-05-15 NOTE — Telephone Encounter (Signed)
 Called and spoke to patient. Verified name and DOB. Patient requesting Metoprolol 25 mg be sent to the pharmacy. She does not want to cut the 50 mg in half. New prescription sent to [facility] pharmacy per patient request.

## 2023-05-23 ENCOUNTER — Other Ambulatory Visit: Payer: Self-pay | Admitting: *Deleted

## 2023-05-23 DIAGNOSIS — E785 Hyperlipidemia, unspecified: Secondary | ICD-10-CM

## 2023-06-19 DIAGNOSIS — R635 Abnormal weight gain: Secondary | ICD-10-CM | POA: Diagnosis not present

## 2023-06-19 DIAGNOSIS — E039 Hypothyroidism, unspecified: Secondary | ICD-10-CM | POA: Diagnosis not present

## 2023-07-05 DIAGNOSIS — G4710 Hypersomnia, unspecified: Secondary | ICD-10-CM | POA: Diagnosis not present

## 2023-07-17 DIAGNOSIS — H2513 Age-related nuclear cataract, bilateral: Secondary | ICD-10-CM | POA: Diagnosis not present

## 2023-07-25 DIAGNOSIS — E785 Hyperlipidemia, unspecified: Secondary | ICD-10-CM | POA: Diagnosis not present

## 2023-07-26 ENCOUNTER — Encounter: Payer: Self-pay | Admitting: *Deleted

## 2023-07-26 LAB — LIPID PANEL
Chol/HDL Ratio: 2.6 ratio (ref 0.0–4.4)
Cholesterol, Total: 140 mg/dL (ref 100–199)
HDL: 53 mg/dL (ref >39)
LDL Chol Calc (NIH): 70 mg/dL (ref 0–99)
Triglycerides: 91 mg/dL (ref 0–149)
VLDL Cholesterol Cal: 17 mg/dL (ref 5–40)

## 2023-08-01 ENCOUNTER — Ambulatory Visit (HOSPITAL_BASED_OUTPATIENT_CLINIC_OR_DEPARTMENT_OTHER): Payer: PPO | Admitting: Internal Medicine

## 2023-08-01 VITALS — BP 132/76 | HR 68 | Ht 63.0 in | Wt 192.2 lb

## 2023-08-01 DIAGNOSIS — R002 Palpitations: Secondary | ICD-10-CM | POA: Diagnosis not present

## 2023-08-01 DIAGNOSIS — G4733 Obstructive sleep apnea (adult) (pediatric): Secondary | ICD-10-CM | POA: Diagnosis not present

## 2023-08-01 DIAGNOSIS — E785 Hyperlipidemia, unspecified: Secondary | ICD-10-CM

## 2023-08-01 DIAGNOSIS — I10 Essential (primary) hypertension: Secondary | ICD-10-CM | POA: Diagnosis not present

## 2023-08-01 NOTE — Progress Notes (Signed)
 OFFICE NOTE  Chief Complaint:  Follow-up palpitations  Primary Care Physician: Sigmund Hazel, MD  HPI:  Susan Valencia is a 68 yo female with a history of palpitations, elevated BP, migraine headaches and soft flow murmur. After our first visit, I elected to start her on low dose propranolol XR 60 mg daily. She reported a marked improvement in her palpitations and almost a complete disappearance of her migraine headaches.  Since that time, she was getting some residual palpitations, only in the late evening. She then switched her medication to QHS dosing and noted an improvement. Today she is pretty much without complaints.  Susan Valencia returns today for follow-up. Overall she seems to be doing very well. She reports a propranolol works well for her palpitations and affect his done a good job of keeping her from having migraine headaches. She's had no side effects from Lipitor and is due for repeat testing. She denies any chest pain or worsening shortness of breath. Her only main concerns now are hot flashes and symptoms of being perimenopausal.  04/06/2016  Susan Valencia was seen today in follow-up. Overall she feels fairly well. She is still having some intermittent hot flashes. She says that her propranolol generally works for her palpitations although the other day she had palpitations mostly through one night. Her heart rate got up into the low 80s according to her fit but watch. She denies any fatigue with these episodes. Recently blood pressure was noted to be increased. Her primary care provider put her on low-dose losartan. This seems to be working for her. She is due for repeat testing of her lipid profile which was well controlled year ago. Weight is up about 11 pounds since her last office visit. She was recently found to be mildly hypothyroid and is on low-dose levothyroxine.  03/08/2017  Susan Valencia returns today for follow-up.  She reports some occasional palpitations that are  breaking through her propranolol.  She has well-controlled TSH and is on alternating levothyroxine 50/25 mcg every other day.  She also takes atorvastatin and losartan.  Blood pressures well controlled today at 130/82.  Thyroid and cholesterol followed by her primary care provider and were performed recently.  10/03/2017  Susan Valencia returns today for follow-up.  She again gets some palpitations, mostly in the evening and in the middle the night.  She recently saw her PCP who referred her for a sleep study however her insurance would not cover her study without a home study.  That is scheduled next week.  She denies any chest pain or worsening shortness of breath.  Blood pressure is reasonably well controlled.  Thyroid function is been normal.  01/07/2020  Susan Valencia returns today for follow-up. Overall she reports good control of her blood pressure which has been about the 120s. Today was elevated somewhat. She has had palpitations but is generally at night when she lays on the left side. She feels like her control is pretty good with propranolol. She is also on some losartan. She struggled with sleep apnea and had 16 AHI during her sleep study but cannot tolerate CPAP. She is considering an oral appliance.  03/08/2021  Susan Valencia returns today for follow-up.  Overall she is doing well although she has been having a little more palpitations mostly at night and when she lays on her left side.  Although this has been well controlled in the past, she said recently she has been having more.  Blood pressure is  good today 110/80.  She was only on low-dose propranolol.  She reports pretty good control of her migraine headaches as well.  EKG shows normal sinus rhythm with some nonspecific T wave changes  03/15/2022  Susan Valencia returns today for follow-up.  She is having persistent palpitations.  This seems to be much worse at night.  She is also been having issues with hot flashes and had one today in the office.   EKG shows a normal sinus rhythm without any ectopic beats.  He had worn a Holter monitor back in 2018 which showed sinus rhythms and occasional PACs and PVCs.  I suspect she is having more of these but we will go ahead and place a 2-week monitor to see if we can better conceptualize this.  I had previously increased her propranolol from 60 to 80 mg that she notes no improvement with that.  05/30/2022  Susan Valencia returns today for follow-up of her palpitations.  She wore a monitor in early December for 2 weeks.  This appeared essentially normal.  There were small number of extra beats but this did not seem to correlate with any symptoms or explain her palpitations.  She reports that her palpitations are typically worse at night.  She does have a history of mild obstructive sleep apnea with an AHI of 16 but did not tolerate CPAP in the past.  She has generally been taking her propranolol at night but higher doses have not been helpful.  We discussed the possibility of switching to a more cardioselective beta-blocker and she seemed interested in that.  It is also possible that her palpitations are unrelated to extra beats.  02/03/2023  Susan Valencia is seen today in follow-up.  He is again having concerns about palpitations which are primarily at night.  She had switched her beta-blocker to take in the evening.  Blood pressure today was elevated as well as heart rate in the 90s.  She had injured her left foot and is on crutches.  08/01/2023  Susan Valencia is seen today in follow-up.  Overall she has been okay but continues to have palpitations.  This occurs mostly every night.  I increased her beta-blocker but she said she felt unwell with it.  She gets some dizziness in the mornings particular when she wakes up but it is better as the day goes on.  She can still hear her heartbeat in her ear, however she had evaluation by ENT for this back in 2010 with a had magnetic resonance angiogram which was negative.  Recent lipids  were good with total 140, triglycerides 91, HDL 53 and LDL 70.  Blood pressure is well-controlled at 132/76 today.  She did have an abnormal sleep study in 2019 showing mild apnea but has not been on CPAP.  Recently her PCP had reordered a in facility study which is scheduled this weekend.  PMHx:  Past Medical History:  Diagnosis Date   ASCUS (atypical squamous cells of undetermined significance) on Pap smear 2009   Daytime sleepiness    Dyslipidemia    Fibroid 2009   Hydrosalpinx 2009   Hyperlipidemia    Hypertension    Hypothyroidism    Menorrhagia 2010   Migraine    with menses   Osteopenia 2006   Osteoporosis 2006   Palpitations    Pelvic mass    Snoring    Systolic murmur 2013   Tachycardia 2013    Past Surgical History:  Procedure Laterality Date   CESAREAN  SECTION     hysteroscopic resection     TRANSTHORACIC ECHOCARDIOGRAM  07/22/2011   EF >55%, normal    FAMHx:  Family History  Problem Relation Age of Onset   Cancer Mother        premenopausal breast    Anuerysm Mother        abdominal and cerebral   Hypertension Father    Heart disease Father 43   Thyroid disease Sister    Cancer Sister        premenopausal breast   Thyroid disease Sister    Anuerysm Sister    Thyroid disease Brother     SOCHx:   reports that she has never smoked. She has never used smokeless tobacco. She reports current alcohol use. She reports that she does not use drugs.  ALLERGIES:  Allergies  Allergen Reactions   Multihance [Gadobenate Dimeglumine] Nausea And Vomiting    Pt has nausea and vomiting after injection of multihance for mri.    ROS: Pertinent items noted in HPI and remainder of comprehensive ROS otherwise negative.  HOME MEDS: Current Outpatient Medications  Medication Sig Dispense Refill   VITAMIN D PO Take by mouth.     atorvastatin (LIPITOR) 40 MG tablet TAKE 1 TABLET BY MOUTH DAILY 90 tablet 1   Calcium Carbonate-Vitamin D (CALCIUM 500 + D PO) Take 2  tablets by mouth daily. (Patient not taking: Reported on 08/01/2023)     denosumab (PROLIA) 60 MG/ML SOSY injection INJECT 1 ML UNDER THE SKIN EVERY 6 MONTHS     EVENING PRIMROSE OIL PO Take by mouth daily.     IBUPROFEN PO Take by mouth as needed.     levothyroxine (SYNTHROID) 75 MCG tablet SMARTSIG:1 Tablet(s) By Mouth 6 Times a Week     losartan (COZAAR) 50 MG tablet TAKE 1 TABLET BY MOUTH EVERY DAY 90 tablet 3   MAGNESIUM PO Take 400 mg by mouth daily.      Melatonin 2.5 MG CAPS Take 1 capsule by mouth at bedtime as needed. Take 1 tablet by mouth once daily at bedtime as needed for sleep.     metoprolol succinate (TOPROL XL) 25 MG 24 hr tablet Take 1 tablet (25 mg total) by mouth daily. 90 tablet 3   Multiple Vitamin (MULTIVITAMIN) tablet Take 1 tablet by mouth daily.     naproxen (NAPROSYN) 500 MG tablet SMARTSIG:1 Tablet(s) By Mouth Every 12 Hours (Patient not taking: Reported on 08/01/2023)     PREVIDENT 5000 BOOSTER PLUS 1.1 % PSTE Place onto teeth daily.     No current facility-administered medications for this visit.    LABS/IMAGING: No results found for this or any previous visit (from the past 48 hours). No results found.  VITALS: BP 132/76   Pulse 68   Ht 5\' 3"  (1.6 m)   Wt 192 lb 3.2 oz (87.2 kg)   SpO2 100%   BMI 34.05 kg/m   EXAM: General appearance: alert and no distress Neck: no carotid bruit, no JVD, and thyroid not enlarged, symmetric, no tenderness/mass/nodules Heart: regular rate and rhythm, S1, S2 normal, no murmur, click, rub or gallop  EKG: Deferred  ASSESSMENT: Palpitations Migraine headaches - improved with b-blocker Dyslipidemia-stable on Lipitor Essential hypertension Hypothyroidism OSA not on CPAP  PLAN: 1.   Mrs. Partin continues to describe palpitations which seem to be worse at night and some fatigue and dizziness in the morning.  This sounds like untreated sleep apnea.  She had some mild OSA and  is known to snore.  An in facility study was  ordered and is scheduled for this weekend.  Hopefully with this imaging we can get EKG strips which might indicate why she is having palpitations at night.  She cannot tolerate higher dose beta-blocker.  Cholesterol appears well-controlled.  No carotid bruits on exam or murmur today.  Plan follow-up with [place] annually or sooner as necessary.  Chrystie Nose, MD, [facility], FACP  [place]  [facility]  Medical Director of the Advanced Lipid Disorders &  Cardiovascular Risk Reduction Clinic Diplomate of the American Board of Clinical Lipidology Attending Cardiologist  Direct Dial: [phone]  Fax: [phone]  Website:  [email or web address]  Lisette Abu Jeriah Corkum 08/01/2023, 1:26 PM

## 2023-08-01 NOTE — Patient Instructions (Signed)
 Medication Instructions:  NO CHANGES  *If you need a refill on your cardiac medications before your next appointment, please call your pharmacy*   Follow-Up: At [facility], you and your health needs are our priority.  As part of our continuing mission to provide you with exceptional heart care, we have created designated Provider Care Teams.  These Care Teams include your primary Cardiologist (physician) and Advanced Practice Providers (APPs -  Physician Assistants and Nurse Practitioners) who all work together to provide you with the care you need, when you need it.  We recommend signing up for the patient portal called "MyChart".  Sign up information is provided on this After Visit Summary.  MyChart is used to connect with patients for Virtual Visits (Telemedicine).  Patients are able to view lab/test results, encounter notes, upcoming appointments, etc.  Non-urgent messages can be sent to your provider as well.   To learn more about what you can do with MyChart, go to [email or web address].    Your next appointment:   12 months with Dr. Rennis Golden      1st Floor: - Lobby - Registration  - Pharmacy  - Lab - Cafe  2nd Floor: - PV Lab - Diagnostic Testing (echo, CT, nuclear med)  3rd Floor: - Vacant  4th Floor: - TCTS (cardiothoracic surgery) - AFib Clinic - Structural Heart Clinic - Vascular Surgery  - Vascular Ultrasound  5th Floor: - HeartCare Cardiology (general and EP) - Clinical Pharmacy for coumadin, hypertension, lipid, weight-loss medications, and med management appointments    Valet parking services will be available as well.

## 2023-08-17 ENCOUNTER — Other Ambulatory Visit: Payer: Self-pay | Admitting: Internal Medicine

## 2023-08-24 DIAGNOSIS — R0683 Snoring: Secondary | ICD-10-CM | POA: Diagnosis not present

## 2023-08-31 DIAGNOSIS — R0683 Snoring: Secondary | ICD-10-CM | POA: Diagnosis not present

## 2023-10-19 DIAGNOSIS — H2513 Age-related nuclear cataract, bilateral: Secondary | ICD-10-CM | POA: Diagnosis not present

## 2023-10-19 DIAGNOSIS — H2511 Age-related nuclear cataract, right eye: Secondary | ICD-10-CM | POA: Diagnosis not present

## 2023-10-19 DIAGNOSIS — H40013 Open angle with borderline findings, low risk, bilateral: Secondary | ICD-10-CM | POA: Diagnosis not present

## 2023-10-19 DIAGNOSIS — H18413 Arcus senilis, bilateral: Secondary | ICD-10-CM | POA: Diagnosis not present

## 2023-10-19 DIAGNOSIS — H25043 Posterior subcapsular polar age-related cataract, bilateral: Secondary | ICD-10-CM | POA: Diagnosis not present

## 2023-10-19 DIAGNOSIS — I10 Essential (primary) hypertension: Secondary | ICD-10-CM | POA: Diagnosis not present

## 2023-10-20 ENCOUNTER — Telehealth: Payer: Self-pay

## 2023-10-20 NOTE — Telephone Encounter (Signed)
   Pre-operative Risk Assessment    Patient Name: Susan Valencia  DOB: [birth year] MRN: [id number]   Date of last office visit: 08/01/23 Dinah Franco, MD Date of next office visit: NONE   Request for Surgical Clearance    Procedure:  CATARACT EXTRACTION W/ INTRAOCULAR LENS IMPLANTATION OF THE RIGHT EYE FOLLOWED BY THE LEFT EYE  Date of Surgery:  Clearance 12/25/23    &   01/15/24                            Surgeon:  DR Emeterio Hansen BEVIS Surgeon's Group or Practice Name:  [facility], [facility] Phone number:  [phone] Fax number:  [phone]   Type of Clearance Requested:   - Medical    Type of Anesthesia:  Not Indicated   Additional requests/questions:    SignedCollin Deal   10/20/2023, 2:29 PM

## 2023-10-20 NOTE — Telephone Encounter (Signed)
   Patient Name: Susan Valencia  DOB: 1956-01-05 MRN: [id number]  Primary Cardiologist: None  Chart reviewed as part of pre-operative protocol coverage. Cataract extractions are recognized in guidelines as low risk surgeries that do not typically require specific preoperative testing or holding of blood thinner therapy. Therefore, given past medical history and time since last visit, based on ACC/AHA guidelines, MEMORI SAMMON would be at acceptable risk for the planned procedure without further cardiovascular testing.   I will route this recommendation to the requesting party via Epic fax function and remove from pre-op pool.  Please call with questions.  Debria Fang, PA-C 10/20/2023, 3:10 PM

## 2023-11-06 DIAGNOSIS — E7800 Pure hypercholesterolemia, unspecified: Secondary | ICD-10-CM | POA: Diagnosis not present

## 2023-11-06 DIAGNOSIS — I10 Essential (primary) hypertension: Secondary | ICD-10-CM | POA: Diagnosis not present

## 2023-11-06 DIAGNOSIS — E669 Obesity, unspecified: Secondary | ICD-10-CM | POA: Diagnosis not present

## 2023-11-06 DIAGNOSIS — E039 Hypothyroidism, unspecified: Secondary | ICD-10-CM | POA: Diagnosis not present

## 2023-11-17 DIAGNOSIS — I10 Essential (primary) hypertension: Secondary | ICD-10-CM | POA: Diagnosis not present

## 2023-12-07 DIAGNOSIS — E7800 Pure hypercholesterolemia, unspecified: Secondary | ICD-10-CM | POA: Diagnosis not present

## 2023-12-07 DIAGNOSIS — E039 Hypothyroidism, unspecified: Secondary | ICD-10-CM | POA: Diagnosis not present

## 2023-12-07 DIAGNOSIS — E669 Obesity, unspecified: Secondary | ICD-10-CM | POA: Diagnosis not present

## 2023-12-07 DIAGNOSIS — I10 Essential (primary) hypertension: Secondary | ICD-10-CM | POA: Diagnosis not present

## 2023-12-14 DIAGNOSIS — M810 Age-related osteoporosis without current pathological fracture: Secondary | ICD-10-CM | POA: Diagnosis not present

## 2023-12-17 DIAGNOSIS — I10 Essential (primary) hypertension: Secondary | ICD-10-CM | POA: Diagnosis not present

## 2023-12-21 DIAGNOSIS — Z1231 Encounter for screening mammogram for malignant neoplasm of breast: Secondary | ICD-10-CM | POA: Diagnosis not present

## 2023-12-25 DIAGNOSIS — H2512 Age-related nuclear cataract, left eye: Secondary | ICD-10-CM | POA: Diagnosis not present

## 2023-12-26 DIAGNOSIS — H2511 Age-related nuclear cataract, right eye: Secondary | ICD-10-CM | POA: Diagnosis not present

## 2024-01-07 DIAGNOSIS — I10 Essential (primary) hypertension: Secondary | ICD-10-CM | POA: Diagnosis not present

## 2024-01-07 DIAGNOSIS — E669 Obesity, unspecified: Secondary | ICD-10-CM | POA: Diagnosis not present

## 2024-01-07 DIAGNOSIS — E7800 Pure hypercholesterolemia, unspecified: Secondary | ICD-10-CM | POA: Diagnosis not present

## 2024-01-07 DIAGNOSIS — E039 Hypothyroidism, unspecified: Secondary | ICD-10-CM | POA: Diagnosis not present

## 2024-01-15 DIAGNOSIS — H2511 Age-related nuclear cataract, right eye: Secondary | ICD-10-CM | POA: Diagnosis not present

## 2024-01-16 DIAGNOSIS — I10 Essential (primary) hypertension: Secondary | ICD-10-CM | POA: Diagnosis not present

## 2024-02-06 DIAGNOSIS — E039 Hypothyroidism, unspecified: Secondary | ICD-10-CM | POA: Diagnosis not present

## 2024-02-06 DIAGNOSIS — I10 Essential (primary) hypertension: Secondary | ICD-10-CM | POA: Diagnosis not present

## 2024-02-06 DIAGNOSIS — E669 Obesity, unspecified: Secondary | ICD-10-CM | POA: Diagnosis not present

## 2024-02-06 DIAGNOSIS — E7800 Pure hypercholesterolemia, unspecified: Secondary | ICD-10-CM | POA: Diagnosis not present

## 2024-03-08 DIAGNOSIS — E039 Hypothyroidism, unspecified: Secondary | ICD-10-CM | POA: Diagnosis not present

## 2024-03-08 DIAGNOSIS — E669 Obesity, unspecified: Secondary | ICD-10-CM | POA: Diagnosis not present

## 2024-03-08 DIAGNOSIS — I10 Essential (primary) hypertension: Secondary | ICD-10-CM | POA: Diagnosis not present

## 2024-03-08 DIAGNOSIS — E7800 Pure hypercholesterolemia, unspecified: Secondary | ICD-10-CM | POA: Diagnosis not present

## 2024-03-13 DIAGNOSIS — H18832 Recurrent erosion of cornea, left eye: Secondary | ICD-10-CM | POA: Diagnosis not present

## 2024-03-16 DIAGNOSIS — I10 Essential (primary) hypertension: Secondary | ICD-10-CM | POA: Diagnosis not present

## 2024-04-07 DIAGNOSIS — E669 Obesity, unspecified: Secondary | ICD-10-CM | POA: Diagnosis not present

## 2024-04-07 DIAGNOSIS — E039 Hypothyroidism, unspecified: Secondary | ICD-10-CM | POA: Diagnosis not present

## 2024-04-07 DIAGNOSIS — I10 Essential (primary) hypertension: Secondary | ICD-10-CM | POA: Diagnosis not present

## 2024-04-07 DIAGNOSIS — E7800 Pure hypercholesterolemia, unspecified: Secondary | ICD-10-CM | POA: Diagnosis not present

## 2024-05-08 ENCOUNTER — Other Ambulatory Visit: Payer: Self-pay

## 2024-05-08 MED ORDER — METOPROLOL SUCCINATE ER 25 MG PO TB24: 25.0000 mg | ORAL_TABLET | Freq: Every day | ORAL | 0 refills | Status: AC

## 2024-08-22 ENCOUNTER — Ambulatory Visit: Admitting: Internal Medicine
